# Patient Record
Sex: Male | Born: 1955 | Race: Black or African American | Hispanic: No | Marital: Single | Smoking: Former smoker
Health system: Southern US, Community
[De-identification: ages and names within clinical notes are randomized; demographics above are authoritative.]

---

## 2021-05-08 ENCOUNTER — Ambulatory Visit (HOSPITAL_COMMUNITY)
Admission: EM | Admit: 2021-05-08 | Discharge: 2021-05-08 | Disposition: A | Discharge status: Home / Self Care | Payer: Self-pay | Attending: Physician Assistant | Admitting: Physician Assistant

## 2021-05-08 ENCOUNTER — Encounter (HOSPITAL_COMMUNITY): Payer: Self-pay

## 2021-05-08 ENCOUNTER — Other Ambulatory Visit: Payer: Self-pay

## 2021-05-08 DIAGNOSIS — M545 Low back pain, unspecified: Secondary | ICD-10-CM

## 2021-05-08 MED ORDER — TIZANIDINE HCL 4 MG PO CAPS: 4.0000 mg | ORAL_CAPSULE | Freq: Two times a day (BID) | ORAL | 0 refills | Status: AC | PRN

## 2021-05-08 NOTE — ED Triage Notes (Signed)
Pt I with c/o lower back pain that has been going on for over 1 weeks  Pt has been taking tylenol and Bengay for relief  Pt states pain is worse when he moves

## 2021-05-08 NOTE — ED Provider Notes (Signed)
[facility]    CSN: [id number] Arrival date & time: 05/08/21  1038      History   Chief Complaint Chief Complaint  Patient presents with  . Back Pain    HPI Bobby Pope is a 65 y.o. male.   Patient presents today with a several month history of intermittent lower back pain that is worsened in the past several weeks.  He denies known injury but does work at [employer] and is tasked with lifting heavy things and believes this could be contributing to symptoms.  Pain is rated 6 on a 0-10 pain scale, localized to lower back without radiation, described as tightness/nagging, no aggravating leaving factors identified.  He has tried Tylenol, Bayer back and body, IcyHot with minimal improvement of symptoms.  He denies any previous spinal surgery or injury.  Denies history of malignancy.  Denies any bowel/bladder incontinence, lower extremity weakness, saddle anesthesia.  He has previously been prescribed muscle relaxers and found this to be very effective and is interested in starting this medication if appropriate today.     History reviewed. No pertinent past medical history.  There are no problems to display for this patient.   History reviewed. No pertinent surgical history.     Home Medications    Prior to Admission medications   Medication Sig Start Date End Date Taking? Authorizing Provider  tiZANidine (ZANAFLEX) 4 MG capsule Take 1 capsule (4 mg total) by mouth 2 (two) times daily as needed for muscle spasms. 05/08/21  Yes Keylen Uzelac, Derry Skill, PA-C    Family History History reviewed. No pertinent family history.  Social History Social History   Tobacco Use  . Smoking status: Current Some Day Smoker  . Smokeless tobacco: Never Used     Allergies   Patient has no known allergies.   Review of Systems Review of Systems  Constitutional: Negative for activity change, appetite change, fatigue and fever.  Respiratory: Negative for cough and shortness of  breath.   Cardiovascular: Negative for chest pain.  Gastrointestinal: Negative for abdominal pain, diarrhea, nausea and vomiting.  Musculoskeletal: Positive for back pain. Negative for arthralgias and myalgias.  Neurological: Negative for dizziness, weakness, light-headedness, numbness and headaches.     Physical Exam Triage Vital Signs ED Triage Vitals  Enc Vitals Group     BP 05/08/21 1107 134/77     Pulse Rate 05/08/21 1107 93     Resp 05/08/21 1107 17     Temp 05/08/21 1107 97.6 F (36.4 C)     Temp Source 05/08/21 1107 Oral     SpO2 05/08/21 1107 97 %     Weight --      Height --      Head Circumference --      Peak Flow --      Pain Score 05/08/21 1105 0     Pain Loc --      Pain Edu? --      Excl. in [place]? --    No data found.  Updated Vital Signs BP 134/77   Pulse 93   Temp 97.6 F (36.4 C) (Oral)   Resp 17   SpO2 97%   Visual Acuity Right Eye Distance:   Left Eye Distance:   Bilateral Distance:    Right Eye Near:   Left Eye Near:    Bilateral Near:     Physical Exam Vitals reviewed.  Constitutional:      General: He is awake.     Appearance: Normal  appearance. He is normal weight. He is not ill-appearing.     Comments: Very pleasant male appears stated age in no acute distress  HENT:     Head: Normocephalic and atraumatic.     Mouth/Throat:     Dentition: Abnormal dentition.     Pharynx: Uvula midline. No oropharyngeal exudate or posterior oropharyngeal erythema.  Cardiovascular:     Rate and Rhythm: Normal rate and regular rhythm.     Heart sounds: No murmur heard.   Pulmonary:     Effort: Pulmonary effort is normal.     Breath sounds: No stridor. Wheezing present. No rhonchi or rales.     Comments: Scattered wheezing throughout lung fields Abdominal:     General: Bowel sounds are normal.     Palpations: Abdomen is soft.     Tenderness: There is no abdominal tenderness.  Musculoskeletal:     Cervical back: No tenderness or bony  tenderness.     Thoracic back: No tenderness or bony tenderness.     Lumbar back: Tenderness present. No bony tenderness. Negative right straight leg raise test and negative left straight leg raise test.     Comments: Back: Decreased range of motion with rotation and forward flexion.  No pain on percussion of vertebrae.  Mild tenderness palpation of her bilateral lumbar paraspinal muscles.  Negative straight leg raise and Faber bilaterally.  Neurological:     Mental Status: He is alert.  Psychiatric:        Behavior: Behavior is cooperative.      UC Treatments / Results  Labs (all labs ordered are listed, but only abnormal results are displayed) Labs Reviewed - No data to display  EKG   Radiology No results found.  Procedures Procedures (including critical care time)  Medications Ordered in UC Medications - No data to display  Initial Impression / Assessment and Plan / UC Course  I have reviewed the triage vital signs and the nursing notes.  Pertinent labs & imaging results that were available during my care of the patient were reviewed by me and considered in my medical decision making (see chart for details).     No indication for x-ray given lack of traumatic injury and no bony tenderness on exam.  Patient was started on Zanaflex with instruction not to drive or drink alcohol with this medication as drowsiness is a common side effect.  Recommended he alternate Tylenol and ibuprofen for additional pain relief.  Encouraged him to use heat and stretch for symptom management.  Discussed that if symptoms persist will need to consider physical therapy referral and/or MRI which need to come from his PCP.  Discussed alarm symptoms that warrant emergent evaluation.  Strict return precautions given to which patient expressed understanding.  Final Clinical Impressions(s) / UC Diagnoses   Final diagnoses:  Acute bilateral low back pain without sciatica     Discharge Instructions      Take Zanaflex up to twice a day as needed for back pain.  This will make you sleepy so do not drive or drink alcohol with it.  Alternate Tylenol and ibuprofen over-the-counter.  You can use heat and stretch for symptom relief.  If anything worsens please return for reevaluation.    ED Prescriptions    Medication Sig Dispense Auth. Provider   tiZANidine (ZANAFLEX) 4 MG capsule Take 1 capsule (4 mg total) by mouth 2 (two) times daily as needed for muscle spasms. 14 capsule Marnee Sherrard, Derry Skill, PA-C  PDMP not reviewed this encounter.   Terrilee Croak, PA-C 05/08/21 1137

## 2021-05-08 NOTE — Discharge Instructions (Addendum)
Take Zanaflex up to twice a day as needed for back pain.  This will make you sleepy so do not drive or drink alcohol with it.  Alternate Tylenol and ibuprofen over-the-counter.  You can use heat and stretch for symptom relief.  If anything worsens please return for reevaluation.

## 2021-05-11 ENCOUNTER — Telehealth (HOSPITAL_COMMUNITY): Payer: Self-pay | Admitting: Emergency Medicine

## 2021-05-11 NOTE — Telephone Encounter (Signed)
Received message that patient had called wanting a more specific work note.    Called patient , verified identity with 2 identifiers.  Patient reports work place requested patient get a more specific note.  reveiwed instructions and work note.    Patient informed no reference to limited work duties.  Told patient that if he needed further follow up to contact pcp as ucc provider said.  Patient said he does not have a pcp.  Told patient if not getting better or worsening, she could return for reevaluation

## 2021-05-17 ENCOUNTER — Inpatient Hospital Stay (HOSPITAL_COMMUNITY)
Admission: EM | Admit: 2021-05-17 | Discharge: 2021-05-23 | DRG: 477 | Disposition: A | Discharge status: Home / Self Care | Payer: Medicaid Other | Attending: Internal Medicine | Admitting: Internal Medicine

## 2021-05-17 ENCOUNTER — Other Ambulatory Visit: Payer: Self-pay

## 2021-05-17 ENCOUNTER — Emergency Department (HOSPITAL_COMMUNITY): Payer: Medicaid Other

## 2021-05-17 DIAGNOSIS — Z6821 Body mass index (BMI) 21.0-21.9, adult: Secondary | ICD-10-CM

## 2021-05-17 DIAGNOSIS — R739 Hyperglycemia, unspecified: Secondary | ICD-10-CM | POA: Diagnosis present

## 2021-05-17 DIAGNOSIS — C761 Malignant neoplasm of thorax: Secondary | ICD-10-CM | POA: Diagnosis present

## 2021-05-17 DIAGNOSIS — R1909 Other intra-abdominal and pelvic swelling, mass and lump: Secondary | ICD-10-CM | POA: Diagnosis present

## 2021-05-17 DIAGNOSIS — R1900 Intra-abdominal and pelvic swelling, mass and lump, unspecified site: Secondary | ICD-10-CM | POA: Diagnosis present

## 2021-05-17 DIAGNOSIS — R591 Generalized enlarged lymph nodes: Secondary | ICD-10-CM

## 2021-05-17 DIAGNOSIS — C801 Malignant (primary) neoplasm, unspecified: Secondary | ICD-10-CM | POA: Diagnosis present

## 2021-05-17 DIAGNOSIS — C7951 Secondary malignant neoplasm of bone: Principal | ICD-10-CM | POA: Diagnosis present

## 2021-05-17 DIAGNOSIS — D630 Anemia in neoplastic disease: Secondary | ICD-10-CM | POA: Diagnosis present

## 2021-05-17 DIAGNOSIS — Z79899 Other long term (current) drug therapy: Secondary | ICD-10-CM

## 2021-05-17 DIAGNOSIS — N400 Enlarged prostate without lower urinary tract symptoms: Secondary | ICD-10-CM | POA: Diagnosis present

## 2021-05-17 DIAGNOSIS — E43 Unspecified severe protein-calorie malnutrition: Secondary | ICD-10-CM | POA: Insufficient documentation

## 2021-05-17 DIAGNOSIS — T1490XA Injury, unspecified, initial encounter: Secondary | ICD-10-CM

## 2021-05-17 DIAGNOSIS — Z20822 Contact with and (suspected) exposure to covid-19: Secondary | ICD-10-CM | POA: Diagnosis present

## 2021-05-17 DIAGNOSIS — J9601 Acute respiratory failure with hypoxia: Secondary | ICD-10-CM | POA: Diagnosis present

## 2021-05-17 DIAGNOSIS — R0602 Shortness of breath: Secondary | ICD-10-CM

## 2021-05-17 DIAGNOSIS — J90 Pleural effusion, not elsewhere classified: Secondary | ICD-10-CM | POA: Diagnosis present

## 2021-05-17 DIAGNOSIS — G8929 Other chronic pain: Secondary | ICD-10-CM | POA: Diagnosis present

## 2021-05-17 DIAGNOSIS — C3492 Malignant neoplasm of unspecified part of left bronchus or lung: Secondary | ICD-10-CM | POA: Diagnosis present

## 2021-05-17 DIAGNOSIS — F17210 Nicotine dependence, cigarettes, uncomplicated: Secondary | ICD-10-CM | POA: Diagnosis present

## 2021-05-17 LAB — CBC WITH DIFFERENTIAL/PLATELET
Abs Immature Granulocytes: 0.02 10*3/uL (ref 0.00–0.07)
Basophils Absolute: 0 10*3/uL (ref 0.0–0.1)
Basophils Relative: 0 %
Eosinophils Absolute: 0 10*3/uL (ref 0.0–0.5)
Eosinophils Relative: 0 %
HCT: 44.4 % (ref 39.0–52.0)
Hemoglobin: 14 g/dL (ref 13.0–17.0)
Immature Granulocytes: 0 %
Lymphocytes Relative: 7 %
Lymphs Abs: 0.7 10*3/uL (ref 0.7–4.0)
MCH: 29.1 pg (ref 26.0–34.0)
MCHC: 31.5 g/dL (ref 30.0–36.0)
MCV: 92.3 fL (ref 80.0–100.0)
Monocytes Absolute: 0.8 10*3/uL (ref 0.1–1.0)
Monocytes Relative: 9 %
Neutro Abs: 8.1 10*3/uL — ABNORMAL HIGH (ref 1.7–7.7)
Neutrophils Relative %: 84 %
Platelets: 366 10*3/uL (ref 150–400)
RBC: 4.81 MIL/uL (ref 4.22–5.81)
RDW: 14.8 % (ref 11.5–15.5)
WBC: 9.7 10*3/uL (ref 4.0–10.5)
nRBC: 0 % (ref 0.0–0.2)

## 2021-05-17 MED ORDER — FENTANYL CITRATE (PF) 100 MCG/2ML IJ SOLN
25.0000 ug | Freq: Once | INTRAMUSCULAR | Status: AC
  Administered 2021-05-18: 25 ug via INTRAVENOUS
  Filled 2021-05-17: qty 2

## 2021-05-17 NOTE — ED Notes (Signed)
Patient back from x-ray 

## 2021-05-17 NOTE — ED Notes (Signed)
Patient transported to X-ray 

## 2021-05-17 NOTE — ED Triage Notes (Addendum)
Pt came from home via POV. C/c: low back pain. Pt also reports falling while getting out of bath tub today. Pt reports falling on "back bone" on the side of the bath tub.  Pt reports being seen at [facility] last week for same complaint, was prescribed muscle relaxer without pain relief.

## 2021-05-18 ENCOUNTER — Encounter (HOSPITAL_COMMUNITY): Payer: Self-pay | Admitting: Emergency Medicine

## 2021-05-18 ENCOUNTER — Emergency Department (HOSPITAL_COMMUNITY): Payer: Medicaid Other

## 2021-05-18 ENCOUNTER — Other Ambulatory Visit: Payer: Self-pay

## 2021-05-18 ENCOUNTER — Inpatient Hospital Stay (HOSPITAL_COMMUNITY): Payer: Medicaid Other

## 2021-05-18 DIAGNOSIS — G8929 Other chronic pain: Secondary | ICD-10-CM | POA: Diagnosis present

## 2021-05-18 DIAGNOSIS — J9601 Acute respiratory failure with hypoxia: Secondary | ICD-10-CM | POA: Diagnosis present

## 2021-05-18 DIAGNOSIS — C3492 Malignant neoplasm of unspecified part of left bronchus or lung: Secondary | ICD-10-CM | POA: Diagnosis present

## 2021-05-18 DIAGNOSIS — Z20822 Contact with and (suspected) exposure to covid-19: Secondary | ICD-10-CM | POA: Diagnosis present

## 2021-05-18 DIAGNOSIS — R1900 Intra-abdominal and pelvic swelling, mass and lump, unspecified site: Secondary | ICD-10-CM

## 2021-05-18 DIAGNOSIS — D630 Anemia in neoplastic disease: Secondary | ICD-10-CM | POA: Diagnosis present

## 2021-05-18 DIAGNOSIS — Z79899 Other long term (current) drug therapy: Secondary | ICD-10-CM | POA: Diagnosis not present

## 2021-05-18 DIAGNOSIS — Z6821 Body mass index (BMI) 21.0-21.9, adult: Secondary | ICD-10-CM | POA: Diagnosis not present

## 2021-05-18 DIAGNOSIS — J90 Pleural effusion, not elsewhere classified: Secondary | ICD-10-CM | POA: Diagnosis present

## 2021-05-18 DIAGNOSIS — C801 Malignant (primary) neoplasm, unspecified: Secondary | ICD-10-CM

## 2021-05-18 DIAGNOSIS — E43 Unspecified severe protein-calorie malnutrition: Secondary | ICD-10-CM | POA: Diagnosis present

## 2021-05-18 DIAGNOSIS — R591 Generalized enlarged lymph nodes: Secondary | ICD-10-CM | POA: Insufficient documentation

## 2021-05-18 DIAGNOSIS — C761 Malignant neoplasm of thorax: Secondary | ICD-10-CM | POA: Diagnosis present

## 2021-05-18 DIAGNOSIS — N400 Enlarged prostate without lower urinary tract symptoms: Secondary | ICD-10-CM | POA: Diagnosis present

## 2021-05-18 DIAGNOSIS — R1909 Other intra-abdominal and pelvic swelling, mass and lump: Secondary | ICD-10-CM | POA: Diagnosis present

## 2021-05-18 DIAGNOSIS — F17210 Nicotine dependence, cigarettes, uncomplicated: Secondary | ICD-10-CM | POA: Diagnosis present

## 2021-05-18 DIAGNOSIS — R739 Hyperglycemia, unspecified: Secondary | ICD-10-CM | POA: Diagnosis present

## 2021-05-18 DIAGNOSIS — C7951 Secondary malignant neoplasm of bone: Secondary | ICD-10-CM | POA: Diagnosis present

## 2021-05-18 LAB — HIV ANTIBODY (ROUTINE TESTING W REFLEX): HIV Screen 4th Generation wRfx: NONREACTIVE

## 2021-05-18 LAB — RESP PANEL BY RT-PCR (FLU A&B, COVID) ARPGX2
Influenza A by PCR: NEGATIVE
Influenza B by PCR: NEGATIVE
SARS Coronavirus 2 by RT PCR: NEGATIVE

## 2021-05-18 LAB — COMPREHENSIVE METABOLIC PANEL
ALT: 12 U/L (ref 0–44)
ALT: 11 U/L (ref 0–44)
AST: 18 U/L (ref 15–41)
AST: 19 U/L (ref 15–41)
Albumin: 3.7 g/dL (ref 3.5–5.0)
Albumin: 3.4 g/dL — ABNORMAL LOW (ref 3.5–5.0)
Alkaline Phosphatase: 88 U/L (ref 38–126)
Alkaline Phosphatase: 81 U/L (ref 38–126)
Anion gap: 12 (ref 5–15)
Anion gap: 13 (ref 5–15)
BUN: 42 mg/dL — ABNORMAL HIGH (ref 8–23)
BUN: 33 mg/dL — ABNORMAL HIGH (ref 8–23)
CO2: 27 mmol/L (ref 22–32)
CO2: 28 mmol/L (ref 22–32)
Calcium: 14.3 mg/dL (ref 8.9–10.3)
Calcium: 13.3 mg/dL (ref 8.9–10.3)
Chloride: 98 mmol/L (ref 98–111)
Chloride: 97 mmol/L — ABNORMAL LOW (ref 98–111)
Creatinine, Ser: 0.92 mg/dL (ref 0.61–1.24)
Creatinine, Ser: 0.83 mg/dL (ref 0.61–1.24)
GFR, Estimated: >60 mL/min (ref >60)
GFR, Estimated: >60 mL/min (ref >60)
Glucose, Bld: 114 mg/dL — ABNORMAL HIGH (ref 70–99)
Glucose, Bld: 109 mg/dL — ABNORMAL HIGH (ref 70–99)
Potassium: 4.2 mmol/L (ref 3.5–5.1)
Potassium: 4.6 mmol/L (ref 3.5–5.1)
Sodium: 137 mmol/L (ref 135–145)
Sodium: 138 mmol/L (ref 135–145)
Total Bilirubin: 0.8 mg/dL (ref 0.3–1.2)
Total Bilirubin: 0.9 mg/dL (ref 0.3–1.2)
Total Protein: 7.3 g/dL (ref 6.5–8.1)
Total Protein: 7.1 g/dL (ref 6.5–8.1)

## 2021-05-18 LAB — MAGNESIUM
Magnesium: 1.5 mg/dL — ABNORMAL LOW (ref 1.7–2.4)
Magnesium: 1.5 mg/dL — ABNORMAL LOW (ref 1.7–2.4)

## 2021-05-18 LAB — CBC WITH DIFFERENTIAL/PLATELET
Abs Immature Granulocytes: 0.03 10*3/uL (ref 0.00–0.07)
Basophils Absolute: 0 10*3/uL (ref 0.0–0.1)
Basophils Relative: 0 %
Eosinophils Absolute: 0 10*3/uL (ref 0.0–0.5)
Eosinophils Relative: 0 %
HCT: 42.5 % (ref 39.0–52.0)
Hemoglobin: 13.7 g/dL (ref 13.0–17.0)
Immature Granulocytes: 0 %
Lymphocytes Relative: 7 %
Lymphs Abs: 0.6 10*3/uL — ABNORMAL LOW (ref 0.7–4.0)
MCH: 29.5 pg (ref 26.0–34.0)
MCHC: 32.2 g/dL (ref 30.0–36.0)
MCV: 91.6 fL (ref 80.0–100.0)
Monocytes Absolute: 0.8 10*3/uL (ref 0.1–1.0)
Monocytes Relative: 9 %
Neutro Abs: 7.8 10*3/uL — ABNORMAL HIGH (ref 1.7–7.7)
Neutrophils Relative %: 84 %
Platelets: 335 10*3/uL (ref 150–400)
RBC: 4.64 MIL/uL (ref 4.22–5.81)
RDW: 15.1 % (ref 11.5–15.5)
WBC: 9.3 10*3/uL (ref 4.0–10.5)
nRBC: 0 % (ref 0.0–0.2)

## 2021-05-18 LAB — TROPONIN I (HIGH SENSITIVITY)
Troponin I (High Sensitivity): 3 ng/L (ref <18)
Troponin I (High Sensitivity): 4 ng/L (ref <18)

## 2021-05-18 LAB — PHOSPHORUS: Phosphorus: 3.1 mg/dL (ref 2.5–4.6)

## 2021-05-18 MED ORDER — MAGNESIUM SULFATE 4 GM/100ML IV SOLN
4.0000 g | Freq: Once | INTRAVENOUS | Status: AC
  Administered 2021-05-18: 4 g via INTRAVENOUS
  Filled 2021-05-18: qty 100

## 2021-05-18 MED ORDER — HYDROMORPHONE HCL 1 MG/ML IJ SOLN
0.5000 mg | INTRAMUSCULAR | Status: DC | PRN
  Administered 2021-05-19: 1 mg via INTRAVENOUS
  Filled 2021-05-18: qty 1

## 2021-05-18 MED ORDER — ONDANSETRON HCL 4 MG/2ML IJ SOLN: 4.0000 mg | Freq: Four times a day (QID) | INTRAMUSCULAR | Status: DC | PRN

## 2021-05-18 MED ORDER — ONDANSETRON HCL 4 MG PO TABS: 4.0000 mg | ORAL_TABLET | Freq: Four times a day (QID) | ORAL | Status: DC | PRN

## 2021-05-18 MED ORDER — SODIUM CHLORIDE 0.9 % IV SOLN: INTRAVENOUS | Status: AC

## 2021-05-18 MED ORDER — HYDROCODONE-ACETAMINOPHEN 5-325 MG PO TABS
1.0000 | ORAL_TABLET | ORAL | Status: DC | PRN
  Administered 2021-05-20 – 2021-05-21 (×4): 2 via ORAL
  Filled 2021-05-18 (×5): qty 2

## 2021-05-18 MED ORDER — SENNOSIDES-DOCUSATE SODIUM 8.6-50 MG PO TABS: 1.0000 | ORAL_TABLET | Freq: Every evening | ORAL | Status: DC | PRN

## 2021-05-18 MED ORDER — ENSURE ENLIVE PO LIQD
237.0000 mL | Freq: Two times a day (BID) | ORAL | Status: DC
  Administered 2021-05-19: 237 mL via ORAL

## 2021-05-18 MED ORDER — BISACODYL 5 MG PO TBEC: 5.0000 mg | DELAYED_RELEASE_TABLET | Freq: Every day | ORAL | Status: DC | PRN

## 2021-05-18 MED ORDER — ZOLEDRONIC ACID 4 MG/5ML IV CONC
4.0000 mg | Freq: Once | INTRAVENOUS | Status: AC
  Administered 2021-05-18: 4 mg via INTRAVENOUS
  Filled 2021-05-18: qty 5

## 2021-05-18 MED ORDER — IOHEXOL 300 MG/ML  SOLN
100.0000 mL | Freq: Once | INTRAMUSCULAR | Status: AC | PRN
  Administered 2021-05-18: 100 mL via INTRAVENOUS

## 2021-05-18 MED ORDER — SODIUM CHLORIDE (PF) 0.9 % IJ SOLN
INTRAMUSCULAR | Status: AC
  Filled 2021-05-18: qty 50

## 2021-05-18 MED ORDER — GADOBUTROL 1 MMOL/ML IV SOLN
6.0000 mL | Freq: Once | INTRAVENOUS | Status: AC | PRN
  Administered 2021-05-18: 6 mL via INTRAVENOUS

## 2021-05-18 MED ORDER — ACETAMINOPHEN 650 MG RE SUPP: 650.0000 mg | Freq: Four times a day (QID) | RECTAL | Status: DC | PRN

## 2021-05-18 MED ORDER — DEXAMETHASONE SODIUM PHOSPHATE 4 MG/ML IJ SOLN
4.0000 mg | Freq: Once | INTRAMUSCULAR | Status: AC
  Administered 2021-05-18: 4 mg via INTRAVENOUS
  Filled 2021-05-18: qty 1

## 2021-05-18 MED ORDER — MAGNESIUM SULFATE 2 GM/50ML IV SOLN
2.0000 g | Freq: Once | INTRAVENOUS | Status: AC
  Administered 2021-05-18: 2 g via INTRAVENOUS
  Filled 2021-05-18: qty 50

## 2021-05-18 MED ORDER — ACETAMINOPHEN 325 MG PO TABS
650.0000 mg | ORAL_TABLET | Freq: Four times a day (QID) | ORAL | Status: DC | PRN
  Administered 2021-05-20 – 2021-05-23 (×6): 650 mg via ORAL
  Filled 2021-05-18 (×6): qty 2

## 2021-05-18 MED ORDER — METHOCARBAMOL 1000 MG/10ML IJ SOLN
500.0000 mg | Freq: Four times a day (QID) | INTRAMUSCULAR | Status: DC | PRN
  Filled 2021-05-18: qty 5

## 2021-05-18 MED ORDER — SODIUM CHLORIDE 0.9 % IV BOLUS
1000.0000 mL | Freq: Once | INTRAVENOUS | Status: AC
  Administered 2021-05-18: 1000 mL via INTRAVENOUS

## 2021-05-18 MED ORDER — FUROSEMIDE 10 MG/ML IJ SOLN
40.0000 mg | Freq: Once | INTRAMUSCULAR | Status: AC
  Administered 2021-05-18: 40 mg via INTRAVENOUS
  Filled 2021-05-18: qty 4

## 2021-05-18 MED ORDER — LORAZEPAM 2 MG/ML IJ SOLN: 1.0000 mg | Freq: Once | INTRAMUSCULAR | Status: DC | PRN

## 2021-05-18 MED ORDER — CALCITONIN (SALMON) 200 UNIT/ACT NA SOLN
1.0000 | Freq: Once | NASAL | Status: AC
  Administered 2021-05-18: 1 via NASAL
  Filled 2021-05-18: qty 3.7

## 2021-05-18 NOTE — ED Notes (Signed)
Calcium 13.3 MD aware

## 2021-05-18 NOTE — Progress Notes (Signed)
     [address]       [place] [address]             [phone]       Images reviewed. Extensive tumor infiltration of the left lung, pleural space and mediastinum Concern for metastatic disease to the retroperitoneum  Unfortunately, patient is not a surgical candidate.   Would recommend Pulm vs IR consult for image guided biopsy Will need PET CT, and MRI brain, but this can be done as outpatient  Daneille Desilva Bary Leriche

## 2021-05-18 NOTE — Consult Note (Signed)
NAME:  Bobby Pope, MRN:  [id number], DOB:  [birth year], LOS: 0 ADMISSION DATE:  05/17/2021, CONSULTATION DATE:  05/18/21 REFERRING MD:  [facility], CHIEF COMPLAINT:  Back pain/ lung mass  History of Present Illness:  65 year old thin chronically ill-appearing AAM who is a current smoker and has really seen physician and denies any other medical history who presented to the emergency room for evaluation of worsening back pain.  ? Wt loss over 30 pounds. .  He has an occasional nonproductive cough but did not feel shortness of breath.  Upon ED evaluation he was found to be afebrile and slightly tachypneic and EKG did show some ST elevations inferiorly but his high-sensitivity troponins were normal.  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  He was given IV fluids, Lasix, calcitonin and Decadron as well as Zometa in the ED.  CT surgery evaluated his scans and felt that he is not a surgical candidate and recommended pulmonary versus interventional radiology evaluation for image guided biopsy.  Recommendation from Dr. Kipp Brood was to obtain a PET CT scan and MRI of the brain.    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 Underlying pulmonary mass difficult to measure/visualize. Associated loculated left pleural effusion. Invasion into the mediastinum with associated narrowing of the left main pulmonary artery as well as left mainstem bronchus. 2. Mediastinal  and left hilar lymphadenopathy. 3. Several right lower lobe pulmonary nodules measuring up to 0.9 x 0.5 cm.   4. Axial and appendicular skeleton lytic lesions with cortical destruction consistent with metastases. 5. Question pathologic nondisplaced fracture of the right posterior tenth rib. 6. A left retroperitoneal 3.3 x 2.1 cm lobulated lesion may be arising from the left adrenal gland versus represent a lymph node. Finding likely metastasis. .     Scheduled Meds: Continuous Infusions: . sodium chloride 150 mL/hr at 05/18/21 0839  . magnesium sulfate bolus IVPB 2 g (05/18/21 1152)  . methocarbamol (ROBAXIN) IV     PRN Meds:.acetaminophen **OR** acetaminophen, bisacodyl, HYDROcodone-acetaminophen, HYDROmorphone (DILAUDID) injection, LORazepam, methocarbamol (ROBAXIN) IV, ondansetron **OR** ondansetron (ZOFRAN) IV, senna-docusate    Interim History / Subjective:  Denies pain or sob at present/ sisters at bedside   Objective   Blood pressure 111/76, pulse 83, temperature 98.7 F (37.1 C), resp. rate (!) 23, height _0  (1.753 m), weight 64.9 kg, SpO2 96 %.        Intake/Output Summary (Last 24 hours) at 05/18/2021 1241 Last data filed at 05/18/2021 0646 Gross per 24 hour  Intake 1221.82 ml  Output 1275 ml  Net -53.18 ml   Filed Weights   05/17/21 2058  Weight: 64.9 kg    Examination: Tmax 98.7  General appearance:    Chronically ill thin bm > stated age  Very hoarse  At Rest 02 sats  94% on RA  No jvd/ no nodes palp Oropharynx clear,  mucosa nl Neck supple Lungs decreased bs on L c dullness/ no wheeze   RRR no s3 or or sign murmur Abd obese  with nl  excursion  Extr warm with no edema or def clubbing noted Neuro  Sensorium intact ,  no apparent motor deficits        Assessment & Plan:  1) 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   2) Trachea appears shifted to L by effects of mass and loculated effusion - of the two,  the effusion would be the easiest to access for cytology with plenty of cells if needed for molecular studies.   >>> rec dx thoracentesis under u/s asap    We can put on the list for FOB 05/20/21 in afternoon if above can't be accomplished quicker by IR   3) Hypercalcemia approp rx by [facility]/agree with plan.    Labs   CBC: Recent Labs  Lab 05/17/21 2327 05/18/21 0850  WBC 9.7 9.3  NEUTROABS 8.1* 7.8*  HGB 14.0 13.7  HCT 44.4 42.5  MCV 92.3 91.6  PLT 366 503    Basic Metabolic Panel: Recent Labs  Lab 05/17/21 2327 05/18/21 0850  NA 137 138  K 4.2 4.6  CL 98 97*  CO2 27 28  GLUCOSE 114* 109*  BUN 42* 33*  CREATININE 0.92 0.83  CALCIUM 14.3* 13.3*  MG 1.5* 1.5*  PHOS  --  3.1   GFR: Estimated Creatinine Clearance: 82.5 mL/min (by C-G formula based on SCr of 0.83 mg/dL). Recent Labs  Lab 05/17/21 2327 05/18/21 0850  WBC 9.7 9.3    Liver Function Tests: Recent Labs  Lab 05/17/21 2327 05/18/21 0850  AST 18 19  ALT 12 11  ALKPHOS 88 81  BILITOT 0.8 0.9  PROT 7.3 7.1  ALBUMIN 3.7 3.4*   No results for input(s): LIPASE, AMYLASE in the last 168 hours. No results for input(s): AMMONIA in the last 168 hours.  ABG No results found for: PHART, PCO2ART, PO2ART, HCO3, TCO2, ACIDBASEDEF, O2SAT   Coagulation Profile: No results for input(s): INR, PROTIME in the last 168 hours.  Cardiac Enzymes: No results for input(s): CKTOTAL, CKMB, CKMBINDEX, TROPONINI in the last 168 hours.  HbA1C: No results found for: HGBA1C  CBG: No results for input(s): GLUCAP in the last 168 hours.     Past Medical History:  He,  has no past medical history on file.   Surgical History:  History reviewed. No pertinent surgical history.   Social History:   reports that he has been smoking cigarettes. He has been smoking about 0.25 packs per day. He has never used smokeless tobacco. He reports current alcohol use.   Family History:  His Family history is unknown by patient.    Allergies No Known Allergies   Home Medications  Prior to Admission medications   Medication Sig Start Date End Date Taking? Authorizing Provider  tiZANidine (ZANAFLEX) 4 MG capsule Take 1 capsule (4 mg total) by mouth 2 (two) times daily as needed for muscle spasms. 05/08/21  Yes Raspet, Derry Skill, PA-C      Christinia Gully, MD [facility] [place] [phone]   After 7:00 pm call Elink  [phone]

## 2021-05-18 NOTE — ED Provider Notes (Signed)
[facility] DEPT Provider Note   CSN: [id number] Arrival date & time: 05/17/21  2049     History Chief Complaint  Patient presents with  . Back Pain  . Fall    Bobby Pope is a 65 y.o. male without known past medical history, presenting to the emergency department for evaluation of chronic back pain.  He states he has been having multiple months of low back pain that comes and goes.  He thinks initially was from heavy lifting though does not recall a particular injury.  Reports back pain is bilateral low back and radiates around his hips.  Sometimes it improves, sometimes it is worse.  It is feeling okay over the last 3 days though he woke up today with pain once more.  He was seen at urgent care for this chronic back pain on 05/08/2021, was discharged with symptomatic management.  He states he fell while in the tub today.  He fell directly down onto his bottom.  Does not think he had any trauma to his chest or ribs.  He is not having any pain to his ribs.  Did not hit his head. Patient denies numbness or weakness in his extremities, bowel or bladder incontinence, saddle paresthesias.  Denies fevers, history of cancer, history of IV drug use.  He has family at bedside.  She reports concern for weight loss over the last multiple months.  Family reports he used to weigh about 200 pounds.  This is unintentional weight loss.  He reports about 45 to 50 pounds.  He states he has poor appetite.  He has not been vomiting or having diarrhea.  He does smoke cigarettes daily.  Has not seen a medical doctor in many years.  Has never had a colonoscopy.  The history is provided by the patient and a relative.       History reviewed. No pertinent past medical history.  There are no problems to display for this patient.   History reviewed. No pertinent surgical history.     History reviewed. No pertinent family history.  Social History   Tobacco Use  . Smoking  status: Current Some Day Smoker  . Smokeless tobacco: Never Used    Home Medications Prior to Admission medications   Medication Sig Start Date End Date Taking? Authorizing Provider  tiZANidine (ZANAFLEX) 4 MG capsule Take 1 capsule (4 mg total) by mouth 2 (two) times daily as needed for muscle spasms. 05/08/21   Raspet, Derry Skill, PA-C    Allergies    Patient has no known allergies.  Review of Systems   Review of Systems  Constitutional: Positive for appetite change and unexpected weight change.  Musculoskeletal: Positive for back pain.  All other systems reviewed and are negative.   Physical Exam Updated Vital Signs BP 117/79 (BP Location: Right Arm)   Pulse 87   Temp 98.1 F (36.7 C) (Oral)   Resp (!) 24   Ht 5\' 9"  (1.753 m)   Wt 64.9 kg   SpO2 92%   BMI 21.12 kg/m   Physical Exam Vitals and nursing note reviewed.  Constitutional:      Appearance: He is well-developed.     Comments: Thin male  HENT:     Head: Normocephalic and atraumatic.  Eyes:     Conjunctiva/sclera: Conjunctivae normal.  Cardiovascular:     Rate and Rhythm: Normal rate and regular rhythm.  Pulmonary:     Comments: Rhonchorous and diminished. No bruising or deformity to  the chest wall.  No tenderness.   Abdominal:     General: Bowel sounds are normal.     Palpations: Abdomen is soft.     Tenderness: There is no abdominal tenderness.  Musculoskeletal:     Comments: No midline T or L-spine tenderness, no bony step-offs or gross deformities.  No producible pain along the low back.  Skin:    General: Skin is warm.  Neurological:     Mental Status: He is alert.     Comments: Normal tone.  5/5 strength in BLE including bilateral hip flexion, as well as strong and equal dorsiflexion/plantar flexion Sensory: light touch normal in BLE extremities.   Gait: deferred due to aoub CV: distal pulses palpable throughout    Psychiatric:        Behavior: Behavior normal.     ED Results / Procedures  / Treatments   Labs (all labs ordered are listed, but only abnormal results are displayed) Labs Reviewed  COMPREHENSIVE METABOLIC PANEL - Abnormal; Notable for the following components:      Result Value   Glucose, Bld 114 (*)    BUN 42 (*)    Calcium 14.3 (*)    All other components within normal limits  CBC WITH DIFFERENTIAL/PLATELET - Abnormal; Notable for the following components:   Neutro Abs 8.1 (*)    All other components within normal limits  RESP PANEL BY RT-PCR (FLU A&B, COVID) ARPGX2  MAGNESIUM  TROPONIN I (HIGH SENSITIVITY)    EKG None  Radiology DG Chest 2 View  Result Date: 05/17/2021 CLINICAL DATA:  Lower back pain, status post fall. EXAM: CHEST - 2 VIEW COMPARISON:  None. FINDINGS: There is complete opacification of the left hemithorax with a very small amount of aerated lung noted within the left lung base. The right lung is mildly hyperinflated and otherwise clear. Right to left shift of the cardiac silhouette is noted which is subsequently limited in evaluation. There is marked severity calcification of the thoracic aorta. Ill-defined deformities of the third, sixth and seventh left ribs are seen. IMPRESSION: Findings likely consistent with partial collapse of the left lung with associated atelectasis, infiltrate and pleural effusion. Correlation with chest CT is recommended, as an underlying mass cannot be excluded. Electronically Signed   By: Virgina Norfolk M.D.   On: 05/17/2021 23:28   DG Lumbar Spine Complete  Result Date: 05/17/2021 CLINICAL DATA:  Status post fall with lower back pain. EXAM: LUMBAR SPINE - COMPLETE 4+ VIEW COMPARISON:  None. FINDINGS: There is no evidence of acute lumbar spine fracture. Alignment is normal. Mild multilevel endplate sclerosis is seen. Intervertebral disc spaces are maintained. Moderate severity calcification of the abdominal aorta is noted. IMPRESSION: Multilevel degenerative changes without an acute lumbar spine fracture or  subluxation. Electronically Signed   By: Virgina Norfolk M.D.   On: 05/17/2021 23:33    Procedures .Critical Care Performed by: Genevieve Ritzel, [place] N, PA-C Authorized by: Torryn Hudspeth, [place] N, PA-C   Critical care provider statement:    Critical care time (minutes):  60   Critical care time was exclusive of:  Separately billable procedures and treating other patients and teaching time   Critical care was necessary to treat or prevent imminent or life-threatening deterioration of the following conditions: severe hypercalcemia.   Critical care was time spent personally by me on the following activities:  Discussions with consultants, evaluation of patient's response to treatment, examination of patient, ordering and performing treatments and interventions, ordering and review of laboratory  studies, ordering and review of radiographic studies, pulse oximetry, re-evaluation of patient's condition, obtaining history from patient or surrogate and review of old charts   I assumed direction of critical care for this patient from another provider in my specialty: no       Medications Ordered in ED Medications  sodium chloride 0.9 % bolus 1,000 mL (has no administration in time range)  furosemide (LASIX) injection 40 mg (has no administration in time range)  zolendronic acid (ZOMETA) 4 mg in sodium chloride 0.9 % 100 mL IVPB (has no administration in time range)  dexamethasone (DECADRON) injection 4 mg (has no administration in time range)  calcitonin (salmon) (MIACALCIN/FORTICAL) nasal spray 1 spray (has no administration in time range)  iohexol (OMNIPAQUE) 300 MG/ML solution 100 mL (has no administration in time range)  fentaNYL (SUBLIMAZE) injection 25 mcg (25 mcg Intravenous Given 05/18/21 0011)    ED Course  I have reviewed the triage vital signs and the nursing notes.  Pertinent labs & imaging results that were available during my care of the patient were reviewed by me and considered in my  medical decision making (see chart for details).    MDM Rules/Calculators/A&P                          Patient is a 65 year old male that is not have outpatient medical care, presenting for chronic intermittent worsening low back pain over multiple months as well as poor p.o. intake with unexpected weight loss of at least 45 pounds over the last multiple months.  Intermittently feels short of breath.  On evaluation lung sounds are diminished, he is rhonchorous, he has a wet sounding cough.  He is thin.  He has no neurodeficits or symptoms concerning for cauda equina.  He is able to reproduce his back pain with movement to though he has no tenderness on exam.  He fell onto his bottom today in the bathtub, does not believe he had a rib injury though he is on sure.  He did not hit his head.  He has no chest wall tenderness, bruising or deformity.    Patient has concerning story for malignancy.  Blood work is ordered, as well as chest x-ray and plain films of the L-spine.  Plain films reveal chronic changes about the L-spine.  Chest x-ray with left lung collapse, mass versus effusion versus pneumothorax with hemothorax.  He is not in respiratory distress, he is not tachycardic.  He does have some minimal shortness of breath.  Followed with CT imaging ordered.   Patient made aware of abnormal CXR and need for admission. He in agreement with care plan at this time.   Blood work reveals critically high calcium level 14.3. Attending physician Dr. Randal Buba made aware. Medications initiated for severe hypercalcemia per attending physician recommendation. EKG is abnormal, pt not having chest pain though will obtain troponin. Mag level also ordered.  Care handed off at shift change to [place], PA-C. Plan to follow CT imaging and admit.     Final Clinical Impression(s) / ED Diagnoses Final diagnoses:  Trauma  Trauma    Rx / DC Orders ED Discharge Orders    None       Lister Brizzi, [place] N,  PA-C 05/18/21 0033    Palumbo, April, MD 05/18/21 1610

## 2021-05-18 NOTE — ED Notes (Signed)
Patient transported to CT 

## 2021-05-18 NOTE — ED Notes (Signed)
Lab to add on troponin  

## 2021-05-18 NOTE — ED Provider Notes (Signed)
Here for lower back pain after falling while in the shower Back pain for several months, thought was heavy lifting but continues.  No reproducible, worse with twisting Unintentional weight loss, 50 pounds 2-3 months Abnormal CXR - he reports "sometimes" SOB, cough  Concern for cancer  Ca 14.3 - getting fluids, 4 mg decadron, 40 lasix, myocalcin nasal, zometa iv  CT chest/abd/pel, L-spine  Admit when resulted  CT results: IMPRESSION: 1. 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 Underlying pulmonary mass difficult to measure/visualize. Associated loculated left pleural effusion. Invasion into the mediastinum with associated narrowing of the left main pulmonary artery as well as left mainstem bronchus. 2. Mediastinal and left hilar lymphadenopathy. 3. Several right lower lobe pulmonary nodules measuring up to 0.9 x 0.5 cm. Findings could represent metastases versus infection/inflammation given debris within the right mainstem bronchus and trachea as well as within the right lower lobe bronchioles with associated right lower lobe mucous plugging. 4. Axial and appendicular skeleton lytic lesions with cortical destruction consistent with metastases. 5. Question pathologic nondisplaced fracture of the right posterior tenth rib. 6. A left retroperitoneal 3.3 x 2.1 cm lobulated lesion may be arising from the left adrenal gland versus represent a lymph node. Finding likely metastasis. 7. Prostatomegaly.  Discussed admission with Dr. Myna Hidalgo, [facility], who will see the patient for admission.      Charlann Lange, PA-C 05/18/21 0303    Palumbo, April, MD 05/18/21 3149

## 2021-05-18 NOTE — Consult Note (Signed)
Chief Complaint: Patient was seen in consultation today for lymphadenopathy/biopsy.  Referring Physician(s): Kerney Elbe [facility])  Supervising Physician: Aletta Edouard  Patient Status: [facility] - ED  History of Present Illness: Bobby Pope is a 65 y.o. male with a past medical history of tobacco use (rarely sees physician and denies other PMH). He presented to [facility] ED 05/17/2021 secondary to back pain and weight loss. Imaging concerning for metastatic disease. He was admitted for further evaluation. Oncology was consulted who requests tissue biopsy for diagnosis.  CT chest/abdomen/pelvis today: 1. 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 Underlying pulmonary mass difficult to measure/visualize. Associated loculated left pleural effusion. Invasion into the mediastinum with associated narrowing of the left main pulmonary artery as well as left mainstem bronchus. 2. Mediastinal and left hilar lymphadenopathy. 3. Several right lower lobe pulmonary nodules measuring up to 0.9 x 0.5 cm. Findings could represent metastases versus infection/inflammation given debris within the right mainstem bronchus and trachea as well as within the right lower lobe bronchioles with associated right lower lobe mucous plugging. 4. Axial and appendicular skeleton lytic lesions with cortical destruction consistent with metastases. 5. Question pathologic nondisplaced fracture of the right posterior tenth rib. 6. A left retroperitoneal 3.3 x 2.1 cm lobulated lesion may be arising from the left adrenal gland versus represent a lymph node. Finding likely metastasis. 7. Prostatomegaly.  IR consulted by Dr. Alfredia Ferguson for possible image-guided biopsy for tissue diagnosis. Patient awake and alert sitting in bed with no complaints at this time. Sister at bedside. Denies fever, chills, chest pain, dyspnea, abdominal pain, or headache.   History reviewed.  No pertinent past medical history.  History reviewed. No pertinent surgical history.  Allergies: Patient has no known allergies.  Medications: Prior to Admission medications   Medication Sig Start Date End Date Taking? Authorizing Provider  tiZANidine (ZANAFLEX) 4 MG capsule Take 1 capsule (4 mg total) by mouth 2 (two) times daily as needed for muscle spasms. 05/08/21  Yes Raspet, Derry Skill, PA-C     Family History  Family history unknown: Yes    Social History   Socioeconomic History  . Marital status: Single    Spouse name: Not on file  . Number of children: Not on file  . Years of education: Not on file  . Highest education level: Not on file  Occupational History  . Not on file  Tobacco Use  . Smoking status: Current Every Day Smoker    Packs/day: 0.25    Types: Cigarettes  . Smokeless tobacco: Never Used  Substance and Sexual Activity  . Alcohol use: Yes    Comment: occasional, denies hx of withdrawal   . Drug use: Not on file  . Sexual activity: Not on file  Other Topics Concern  . Not on file  Social History Narrative  . Not on file   Social Determinants of Health   Financial Resource Strain: Not on file  Food Insecurity: Not on file  Transportation Needs: Not on file  Physical Activity: Not on file  Stress: Not on file  Social Connections: Not on file     Review of Systems: A 12 point ROS discussed and pertinent positives are indicated in the HPI above.  All other systems are negative.  Review of Systems  Constitutional: Negative for chills and fever.  Respiratory: Negative for shortness of breath and wheezing.   Cardiovascular: Negative for chest pain and palpitations.  Gastrointestinal: Negative for abdominal pain.  Neurological: Negative  for headaches.  Psychiatric/Behavioral: Negative for confusion.    Vital Signs: BP 111/76   Pulse 83   Temp 98.7 F (37.1 C)   Resp (!) 23   Ht 5\' 9"  (1.753 m)   Wt 143 lb (64.9 kg)   SpO2 96%   BMI  21.12 kg/m   Physical Exam Vitals and nursing note reviewed.  Constitutional:      General: He is not in acute distress. Cardiovascular:     Rate and Rhythm: Normal rate and regular rhythm.     Heart sounds: Normal heart sounds. No murmur heard.   Pulmonary:     Effort: Pulmonary effort is normal. No respiratory distress.     Breath sounds: Wheezing present.     Comments: Decreased breath sounds on left. Skin:    General: Skin is warm and dry.  Neurological:     Mental Status: He is alert and oriented to person, place, and time.      MD Evaluation Airway: WNL Heart: WNL Abdomen: WNL Chest/ Lungs: WNL ASA  Classification: 3 Mallampati/Airway Score: Two   Imaging: DG Chest 2 View  Result Date: 05/17/2021 CLINICAL DATA:  Lower back pain, status post fall. EXAM: CHEST - 2 VIEW COMPARISON:  None. FINDINGS: There is complete opacification of the left hemithorax with a very small amount of aerated lung noted within the left lung base. The right lung is mildly hyperinflated and otherwise clear. Right to left shift of the cardiac silhouette is noted which is subsequently limited in evaluation. There is marked severity calcification of the thoracic aorta. Ill-defined deformities of the third, sixth and seventh left ribs are seen. IMPRESSION: Findings likely consistent with partial collapse of the left lung with associated atelectasis, infiltrate and pleural effusion. Correlation with chest CT is recommended, as an underlying mass cannot be excluded. Electronically Signed   By: Virgina Norfolk M.D.   On: 05/17/2021 23:28   DG Lumbar Spine Complete  Result Date: 05/17/2021 CLINICAL DATA:  Status post fall with lower back pain. EXAM: LUMBAR SPINE - COMPLETE 4+ VIEW COMPARISON:  None. FINDINGS: There is no evidence of acute lumbar spine fracture. Alignment is normal. Mild multilevel endplate sclerosis is seen. Intervertebral disc spaces are maintained. Moderate severity calcification of the  abdominal aorta is noted. IMPRESSION: Multilevel degenerative changes without an acute lumbar spine fracture or subluxation. Electronically Signed   By: Virgina Norfolk M.D.   On: 05/17/2021 23:33   CT CHEST ABDOMEN PELVIS W CONTRAST  Result Date: 05/18/2021 CLINICAL DATA:  Lower back pain status post fall. Partial collapse of left lung with associated infiltrate and pleural effusion. Question underlying mass on chest x-ray. EXAM: CT CHEST, ABDOMEN, AND PELVIS WITH CONTRAST CT LUMBAR SPINE WITHOUT CONTRAST TECHNIQUE: Multidetector CT imaging of the chest, abdomen and pelvis was performed following the standard protocol during bolus administration of intravenous contrast. Multidetector CT imaging of the lumbar spine was performed without intravenous contrast administration. Multiplanar CT image reconstructions were also generated. CONTRAST:  163mL OMNIPAQUE IOHEXOL 300 MG/ML  SOLN COMPARISON:  Chest x-ray 05/17/2021 FINDINGS: CT CHEST FINDINGS Cardiovascular: Normal heart size. No significant pericardial effusion. The thoracic aorta is normal in caliber. Mild-to-moderate atherosclerotic plaque of the thoracic aorta. At least 3 vessel coronary artery calcifications. The main pulmonary artery is enlarged in caliber measuring up to 3.7 cm. No central or proximal segmental pulmonary embolus. Mediastinum/Nodes: Mediastinal lymphadenopathy with as an example a 1.5 cm precarinal lymph node (2:27). Also noted subcarinal lymph node measuring 1.6 cm (  2:33). No definite right hilar lymphadenopathy. Left hilar lymphadenopathy unable to measure. Lungs/Pleura: Almost complete heterogeneous opacification of the left hemithorax with associated pleural thickening and nodularity. Underlying pulmonary mass is difficult to measure. Invasion into the mediastinum is noted. Associated narrowing of the left main pulmonary artery as well as left mainstem bronchus. Associated loculated left pleural effusion. Several right lower lobe  pulmonary nodule measuring up to 0.9 x 0.5 cm (4:121). Calcified pulmonary nodule within the right upper lobe. Pulmonary micronodule within the right upper lobe (4:35). Debris within the right mainstem bronchus and trachea. As well as within the right lower lobe bronchials. Musculoskeletal: Scattered axial and appendicular skeleton lytic lesions. Question pathologic nondisplaced fracture of the right posterior tenth rib (2:49). CT ABDOMEN PELVIS FINDINGS Hepatobiliary: No focal liver abnormality. No gallstones, gallbladder wall thickening, or pericholecystic fluid. No biliary dilatation. Pancreas: No focal lesion. Normal pancreatic contour. No surrounding inflammatory changes. No main pancreatic ductal dilatation. Spleen: Normal in size without focal abnormality. Adrenals/Urinary Tract: No right adrenal nodule. There is a retroperitoneal left upper 3.3 x 2.1 cm lobulated lesion that may be arising from the left adrenal gland. Bilateral kidneys enhance symmetrically. No hydronephrosis. No hydroureter. The urinary bladder is unremarkable. Stomach/Bowel: Stomach is within normal limits. No evidence of bowel wall thickening or dilatation. Appendix appears normal. Vascular/Lymphatic: No abdominal aorta or iliac aneurysm. Moderate atherosclerotic plaque of the aorta and its branches. No pelvic or inguinal lymphadenopathy. Reproductive: The prostate is enlarged measuring up to 5.1 cm. Other: No intraperitoneal free fluid. No intraperitoneal free gas. No organized fluid collection. Musculoskeletal: Scattered axial and appendicular skeleton lytic lesions. CT LUMBAR SPINE: Segmentation: 5 non-rib-bearing lumbar vertebral bodies. Alignment: Normal. Vertebra: Several lytic lesions with cortical destruction, as an example: Posterior wall and inferior endplate of the L4 level (504:22), right iliac bone (3:97), S1 level (504:22, 3:101). Soft tissues: Question associated soft tissue densities along the lytic lesions with as an  example along the right iliac bone (2:91). IMPRESSION: 1. 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 Underlying pulmonary mass difficult to measure/visualize. Associated loculated left pleural effusion. Invasion into the mediastinum with associated narrowing of the left main pulmonary artery as well as left mainstem bronchus. 2. Mediastinal and left hilar lymphadenopathy. 3. Several right lower lobe pulmonary nodules measuring up to 0.9 x 0.5 cm. Findings could represent metastases versus infection/inflammation given debris within the right mainstem bronchus and trachea as well as within the right lower lobe bronchioles with associated right lower lobe mucous plugging. 4. Axial and appendicular skeleton lytic lesions with cortical destruction consistent with metastases. 5. Question pathologic nondisplaced fracture of the right posterior tenth rib. 6. A left retroperitoneal 3.3 x 2.1 cm lobulated lesion may be arising from the left adrenal gland versus represent a lymph node. Finding likely metastasis. 7. Prostatomegaly. Electronically Signed   By: Iven Finn M.D.   On: 05/18/2021 02:05   CT L-SPINE NO CHARGE  Result Date: 05/18/2021 CLINICAL DATA:  Lower back pain status post fall. Partial collapse of left lung with associated infiltrate and pleural effusion. Question underlying mass on chest x-ray. EXAM: CT CHEST, ABDOMEN, AND PELVIS WITH CONTRAST CT LUMBAR SPINE WITHOUT CONTRAST TECHNIQUE: Multidetector CT imaging of the chest, abdomen and pelvis was performed following the standard protocol during bolus administration of intravenous contrast. Multidetector CT imaging of the lumbar spine was performed without intravenous contrast administration. Multiplanar CT image reconstructions were also generated. CONTRAST:  153mL OMNIPAQUE IOHEXOL 300 MG/ML  SOLN COMPARISON:  Chest x-ray 05/17/2021 FINDINGS: CT CHEST FINDINGS Cardiovascular: Normal  heart size. No significant pericardial effusion. The thoracic aorta is normal in caliber. Mild-to-moderate atherosclerotic plaque of the thoracic aorta. At least 3 vessel coronary artery calcifications. The main pulmonary artery is enlarged in caliber measuring up to 3.7 cm. No central or proximal segmental pulmonary embolus. Mediastinum/Nodes: Mediastinal lymphadenopathy with as an example a 1.5 cm precarinal lymph node (2:27). Also noted subcarinal lymph node measuring 1.6 cm (2:33). No definite right hilar lymphadenopathy. Left hilar lymphadenopathy unable to measure. Lungs/Pleura: Almost complete heterogeneous opacification of the left hemithorax with associated pleural thickening and nodularity. Underlying pulmonary mass is difficult to measure. Invasion into the mediastinum is noted. Associated narrowing of the left main pulmonary artery as well as left mainstem bronchus. Associated loculated left pleural effusion. Several right lower lobe pulmonary nodule measuring up to 0.9 x 0.5 cm (4:121). Calcified pulmonary nodule within the right upper lobe. Pulmonary micronodule within the right upper lobe (4:35). Debris within the right mainstem bronchus and trachea. As well as within the right lower lobe bronchials. Musculoskeletal: Scattered axial and appendicular skeleton lytic lesions. Question pathologic nondisplaced fracture of the right posterior tenth rib (2:49). CT ABDOMEN PELVIS FINDINGS Hepatobiliary: No focal liver abnormality. No gallstones, gallbladder wall thickening, or pericholecystic fluid. No biliary dilatation. Pancreas: No focal lesion. Normal pancreatic contour. No surrounding inflammatory changes. No main pancreatic ductal dilatation. Spleen: Normal in size without focal abnormality. Adrenals/Urinary Tract: No right adrenal nodule. There is a retroperitoneal left upper 3.3 x 2.1 cm lobulated lesion that may be arising from the left adrenal gland. Bilateral kidneys enhance symmetrically. No  hydronephrosis. No hydroureter. The urinary bladder is unremarkable. Stomach/Bowel: Stomach is within normal limits. No evidence of bowel wall thickening or dilatation. Appendix appears normal. Vascular/Lymphatic: No abdominal aorta or iliac aneurysm. Moderate atherosclerotic plaque of the aorta and its branches. No pelvic or inguinal lymphadenopathy. Reproductive: The prostate is enlarged measuring up to 5.1 cm. Other: No intraperitoneal free fluid. No intraperitoneal free gas. No organized fluid collection. Musculoskeletal: Scattered axial and appendicular skeleton lytic lesions. CT LUMBAR SPINE: Segmentation: 5 non-rib-bearing lumbar vertebral bodies. Alignment: Normal. Vertebra: Several lytic lesions with cortical destruction, as an example: Posterior wall and inferior endplate of the L4 level (504:22), right iliac bone (3:97), S1 level (504:22, 3:101). Soft tissues: Question associated soft tissue densities along the lytic lesions with as an example along the right iliac bone (2:91). IMPRESSION: 1. 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 Underlying pulmonary mass difficult to measure/visualize. Associated loculated left pleural effusion. Invasion into the mediastinum with associated narrowing of the left main pulmonary artery as well as left mainstem bronchus. 2. Mediastinal and left hilar lymphadenopathy. 3. Several right lower lobe pulmonary nodules measuring up to 0.9 x 0.5 cm. Findings could represent metastases versus infection/inflammation given debris within the right mainstem bronchus and trachea as well as within the right lower lobe bronchioles with associated right lower lobe mucous plugging. 4. Axial and appendicular skeleton lytic lesions with cortical destruction consistent with metastases. 5. Question pathologic nondisplaced fracture of the right posterior tenth rib. 6. A left retroperitoneal 3.3 x 2.1 cm lobulated lesion may be  arising from the left adrenal gland versus represent a lymph node. Finding likely metastasis. 7. Prostatomegaly. Electronically Signed   By: Iven Finn M.D.   On: 05/18/2021 02:05    Labs:  CBC: Recent Labs    05/17/21 2327 05/18/21 0850  WBC 9.7 9.3  HGB 14.0  13.7  HCT 44.4 42.5  PLT 366 335    COAGS: No results for input(s): INR, APTT in the last 8760 hours.  BMP: Recent Labs    05/17/21 2327 05/18/21 0850  NA 137 138  K 4.2 4.6  CL 98 97*  CO2 27 28  GLUCOSE 114* 109*  BUN 42* 33*  CALCIUM 14.3* 13.3*  CREATININE 0.92 0.83  GFRNONAA >60 >60    LIVER FUNCTION TESTS: Recent Labs    05/17/21 2327 05/18/21 0850  BILITOT 0.8 0.9  AST 18 19  ALT 12 11  ALKPHOS 88 81  PROT 7.3 7.1  ALBUMIN 3.7 3.4*     Assessment and Plan:  Imaging/clinical findings concerning for metastatic disease (left hemithorax malignancy, underlying pulmonary mass, pulmonary nodules, lymphadenopathy, lytic lesions, tobacco use, unintentional weight loss). Plan for image-guided left supraclavicular lymph node biopsy in IR tentatively for Monday 05/20/2021 pending IR schedule. Per Dr. Kathlene Cote, if unable to visualize LN with [place] will recommend bronch for possible biopsy. Patient will be NPO at midnight. Afebrile.  Risks and benefits discussed with the patient including, but not limited to bleeding, infection, damage to adjacent structures or low yield requiring additional tests. All of the patient's questions were answered, patient is agreeable to proceed. Consent signed and in IR control room.   Thank you for this interesting consult.  I greatly enjoyed meeting Riel Petrow and look forward to participating in their care.  A copy of this report was sent to the requesting provider on this date.  Electronically Signed: Earley Abide, PA-C 05/18/2021, 2:59 PM   I spent a total of 20 Minutes in face to face in clinical consultation, greater than 50% of which was  counseling/coordinating care for lymphadenopathy/biopsy.

## 2021-05-18 NOTE — Progress Notes (Signed)
Care started prior to midnight in the emergency room and patient was admitted early this morning after midnight by Dr. Christia Reading Opyd and I am in current agreement with this assessment and plan.  Additional changes of the plan of care been made accordingly.  The patient is a 65 year old thin chronically ill-appearing AAM who is a current smoker and has really seen physician and denies any other medical history who presented to the emergency room for evaluation of worsening back pain.  His sister also raise concern for unintentional weight loss for the last few months and patient does not know how much she lost with thinks is over 30 pounds.  He reported waxing and waning back pain without injury or inciting event involving bilateral lower back with no extremity numbness or weakness.  He denies any incontinence or saddle anesthesia but did report a loss of appetite and weight loss over several months.  He has an occasional nonproductive cough but did not feel shortness of breath.  Upon ED evaluation he was found to be afebrile and slightly tachypneic and EKG did show some ST elevations inferiorly but his high-sensitivity troponins were normal.  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  He was given IV fluids, Lasix, calcitonin and Decadron as well as Zometa in the ED.  CT surgery evaluated his scans and felt that he is not a surgical candidate and recommended pulmonary versus interventional radiology evaluation for image guided biopsy.  Recommendation from Dr. Kipp Brood was to obtain a PET CT scan and MRI of the brain.  We will obtain an MRI of the brain here and we have consulted pulmonary and interventional  radiology for further evaluation and assistance.  Currently he is being evaluated and Treated for the following but not limited to:  Metastatic disease with suspecting lung cancer primary -Smoker who denies PMHx but has not seen a physician in many years presents with worsening back pain and is found to have left lung mass with loculated effusion, invasion into mediastinum, lytic bone lesions, RLL nodules, and retroperitoneal lesion  -Discussed with CT surgery who will review scans  and he is currently not a surgical candidate -Discussed with oncology, will need biopsy, MRI brain if small cell lung cancer   -Consulted pulmonary and interventional radiology for biopsy  Hypercalcemia  -Serum calcium 14.3 on admission  and improved to 13.3 -Appears to be asymptomatic, likely related to his malignancy  -Treated with Zometa, calcitonin, and IVF in ED  -Continue volume expansion -Continue monitor and trend and repeat CMP in a.m.  Hypomagnesemia -Patient's Mag Level was 1.5 -Replete with IV Mag Sulfate 2 grams -Continue to Monitor and Trend -Repeat Mag Level in the AM  Hyperglycemia -Patient's blood sugar ranging from 109-114 -Likely reactive and also in the setting of steroid demargination -Check hemoglobin A1c next-continue to monitor blood sugars per protocol and if necessary will add a sensitive NovoLog sliding scale insulin AC   We will continue to monitor the patient's clinical response to intervention and repeat blood work and follow-up on MRI imaging in the a.m.

## 2021-05-18 NOTE — H&P (Signed)
History and Physical    Dabid Godown NLZ:[id number] DOB: January 27, 1956 DOA: 05/17/2021  PCP: Pcp, No   Patient coming from: Home   Chief Complaint: Back pain, weight loss   HPI: Bobby Pope is a 65 y.o. male with medical history significant for current smoker, has rarely seen a physician and denies any other known medical history, now presenting to the emergency department for evaluation of worsening back pain.  His sister also raises concern for unintentional weight loss over the past few months.  Patient reports waxing and waning back pain without any injury or inciting event, involving the bilateral lower back with no lower extremity numbness or weakness.  He denies incontinence or saddle paresthesias.  He denies any fevers or chills.  He reports a loss of appetite and 40 to 50 pound weight loss over several months.  He denies chest pain.  He has occasional nonproductive cough but does not feel particularly short of breath.  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  ED Course: Upon arrival to the ED, patient is found to be afebrile, saturating low 90s on room air, slightly tachypneic, and with stable blood pressure.  EKG features sinus rhythm with ST elevations inferiorly.  High-sensitivity troponin was normal x2.  Chemistry panel with elevated BUN to creatinine ratio, magnesium 1.5, and calcium 14.3.  CBC unremarkable.  CT of the chest, abdomen, and pelvis is concerning for left hemithorax malignancy with associated pleural thickening and nodularity, loculated effusion, invasion into the mediastinum with narrowing of the left main pulmonary artery and left mainstem bronchus, skeletal lytic lesions, and retroperitoneal lesion.  Patient was given IV fluids, Lasix, calcitonin, Decadron, and Zometa in the ED.  Review of Systems:  All other systems reviewed and apart from HPI, are negative.  History reviewed. No pertinent past medical history.  History reviewed. No pertinent surgical  history.  Social History:   reports that he has been smoking cigarettes. He has been smoking about 0.25 packs per day. He has never used smokeless tobacco. He reports current alcohol use. No history on file for drug use.  No Known Allergies  Family History  Family history unknown: Yes     Prior to Admission medications   Medication Sig Start Date End Date Taking? Authorizing Provider  tiZANidine (ZANAFLEX) 4 MG capsule Take 1 capsule (4 mg total) by mouth 2 (two) times daily as needed for muscle spasms. 05/08/21  Yes Raspet, Derry Skill, PA-C    Physical Exam: Vitals:   05/18/21 0400 05/18/21 0430 05/18/21 0502 05/18/21 0512  BP: 111/73 96/61 98/74    Pulse: 88 90 90   Resp: (!) 24 (!) 28 (!) 24   Temp:    98.3 F (36.8 C)  TempSrc:    Oral  SpO2: 96% 96% 99%   Weight:      Height:        Constitutional: NAD, calm, cachectic   Eyes: PERTLA, lids and conjunctivae normal ENMT: Mucous membranes are moist. Posterior pharynx clear of any exudate or lesions.   Neck: supple, no masses  Respiratory: Diminished on left, no wheezing, no crackles. No accessory muscle use.  Cardiovascular: S1 & S2 heard, regular rate and rhythm. No extremity edema. Abdomen: No distension, no tenderness, soft. Bowel sounds active.  Musculoskeletal: no clubbing / cyanosis. No joint deformity upper and lower extremities.   Skin: no significant rashes, lesions, ulcers. Warm, dry, well-perfused. Neurologic: CN 2-12 grossly intact. Sensation intact. Moving all extremities.  Psychiatric: Alert and oriented  to person, place, and situation. Pleasant and cooperative.    Labs and Imaging on Admission: I have personally reviewed following labs and imaging studies  CBC: Recent Labs  Lab 05/17/21 2327  WBC 9.7  NEUTROABS 8.1*  HGB 14.0  HCT 44.4  MCV 92.3  PLT 737   Basic Metabolic Panel: Recent Labs  Lab 05/17/21 2327  NA 137  K 4.2  CL 98  CO2 27  GLUCOSE 114*  BUN 42*  CREATININE 0.92  CALCIUM  14.3*  MG 1.5*   GFR: Estimated Creatinine Clearance: 74.5 mL/min (by C-G formula based on SCr of 0.92 mg/dL). Liver Function Tests: Recent Labs  Lab 05/17/21 2327  AST 18  ALT 12  ALKPHOS 88  BILITOT 0.8  PROT 7.3  ALBUMIN 3.7   No results for input(s): LIPASE, AMYLASE in the last 168 hours. No results for input(s): AMMONIA in the last 168 hours. Coagulation Profile: No results for input(s): INR, PROTIME in the last 168 hours. Cardiac Enzymes: No results for input(s): CKTOTAL, CKMB, CKMBINDEX, TROPONINI in the last 168 hours. BNP (last 3 results) No results for input(s): PROBNP in the last 8760 hours. HbA1C: No results for input(s): HGBA1C in the last 72 hours. CBG: No results for input(s): GLUCAP in the last 168 hours. Lipid Profile: No results for input(s): CHOL, HDL, LDLCALC, TRIG, CHOLHDL, LDLDIRECT in the last 72 hours. Thyroid Function Tests: No results for input(s): TSH, T4TOTAL, FREET4, T3FREE, THYROIDAB in the last 72 hours. Anemia Panel: No results for input(s): VITAMINB12, FOLATE, FERRITIN, TIBC, IRON, RETICCTPCT in the last 72 hours. Urine analysis: No results found for: COLORURINE, APPEARANCEUR, LABSPEC, PHURINE, GLUCOSEU, HGBUR, BILIRUBINUR, KETONESUR, PROTEINUR, UROBILINOGEN, NITRITE, LEUKOCYTESUR Sepsis Labs: @LABRCNTIP (procalcitonin:4,lacticidven:4) ) Recent Results (from the past 240 hour(s))  Resp Panel by RT-PCR (Flu A&B, Covid) Nasopharyngeal Swab     Status: None   Collection Time: 05/18/21 12:18 AM   Specimen: Nasopharyngeal Swab; Nasopharyngeal(NP) swabs in vial transport medium  Result Value Ref Range Status   SARS Coronavirus 2 by RT PCR NEGATIVE NEGATIVE Final    Comment: (NOTE) SARS-CoV-2 target nucleic acids are NOT DETECTED.  The SARS-CoV-2 RNA is generally detectable in upper respiratory specimens during the acute phase of infection. The lowest concentration of SARS-CoV-2 viral copies this assay can detect is 138 copies/mL. A negative  result does not preclude SARS-Cov-2 infection and should not be used as the sole basis for treatment or other patient management decisions. A negative result may occur with  improper specimen collection/handling, submission of specimen other than nasopharyngeal swab, presence of viral mutation(s) within the areas targeted by this assay, and inadequate number of viral copies(<138 copies/mL). A negative result must be combined with clinical observations, patient history, and epidemiological information. The expected result is Negative.  Fact Sheet for Patients:  EntrepreneurPulse.com.au  Fact Sheet for Healthcare Providers:  IncredibleEmployment.be  This test is no t yet approved or cleared by the [place] FDA and  has been authorized for detection and/or diagnosis of SARS-CoV-2 by FDA under an Emergency Use Authorization (EUA). This EUA will remain  in effect (meaning this test can be used) for the duration of the COVID-19 declaration under Section 564(b)(1) of the Act, 21 U.S.C.section 360bbb-3(b)(1), unless the authorization is terminated  or revoked sooner.       Influenza A by PCR NEGATIVE NEGATIVE Final   Influenza B by PCR NEGATIVE NEGATIVE Final    Comment: (NOTE) The Xpert Xpress SARS-CoV-2/FLU/RSV plus assay is intended as an aid in  the diagnosis of influenza from Nasopharyngeal swab specimens and should not be used as a sole basis for treatment. Nasal washings and aspirates are unacceptable for Xpert Xpress SARS-CoV-2/FLU/RSV testing.  Fact Sheet for Patients: EntrepreneurPulse.com.au  Fact Sheet for Healthcare Providers: IncredibleEmployment.be  This test is not yet approved or cleared by the [place] FDA and has been authorized for detection and/or diagnosis of SARS-CoV-2 by FDA under an Emergency Use Authorization (EUA). This EUA will remain in effect (meaning this test can be used)  for the duration of the COVID-19 declaration under Section 564(b)(1) of the Act, 21 U.S.C. section 360bbb-3(b)(1), unless the authorization is terminated or revoked.  Performed at [facility], [place] [address], [place], [place] [address]      Radiological Exams on Admission: DG Chest 2 View  Result Date: 05/17/2021 CLINICAL DATA:  Lower back pain, status post fall. EXAM: CHEST - 2 VIEW COMPARISON:  None. FINDINGS: There is complete opacification of the left hemithorax with a very small amount of aerated lung noted within the left lung base. The right lung is mildly hyperinflated and otherwise clear. Right to left shift of the cardiac silhouette is noted which is subsequently limited in evaluation. There is marked severity calcification of the thoracic aorta. Ill-defined deformities of the third, sixth and seventh left ribs are seen. IMPRESSION: Findings likely consistent with partial collapse of the left lung with associated atelectasis, infiltrate and pleural effusion. Correlation with chest CT is recommended, as an underlying mass cannot be excluded. Electronically Signed   By: Virgina Norfolk M.D.   On: 05/17/2021 23:28   DG Lumbar Spine Complete  Result Date: 05/17/2021 CLINICAL DATA:  Status post fall with lower back pain. EXAM: LUMBAR SPINE - COMPLETE 4+ VIEW COMPARISON:  None. FINDINGS: There is no evidence of acute lumbar spine fracture. Alignment is normal. Mild multilevel endplate sclerosis is seen. Intervertebral disc spaces are maintained. Moderate severity calcification of the abdominal aorta is noted. IMPRESSION: Multilevel degenerative changes without an acute lumbar spine fracture or subluxation. Electronically Signed   By: Virgina Norfolk M.D.   On: 05/17/2021 23:33   CT CHEST ABDOMEN PELVIS W CONTRAST  Result Date: 05/18/2021 CLINICAL DATA:  Lower back pain status post fall. Partial collapse of left lung with associated infiltrate and pleural effusion.  Question underlying mass on chest x-ray. EXAM: CT CHEST, ABDOMEN, AND PELVIS WITH CONTRAST CT LUMBAR SPINE WITHOUT CONTRAST TECHNIQUE: Multidetector CT imaging of the chest, abdomen and pelvis was performed following the standard protocol during bolus administration of intravenous contrast. Multidetector CT imaging of the lumbar spine was performed without intravenous contrast administration. Multiplanar CT image reconstructions were also generated. CONTRAST:  172mL OMNIPAQUE IOHEXOL 300 MG/ML  SOLN COMPARISON:  Chest x-ray 05/17/2021 FINDINGS: CT CHEST FINDINGS Cardiovascular: Normal heart size. No significant pericardial effusion. The thoracic aorta is normal in caliber. Mild-to-moderate atherosclerotic plaque of the thoracic aorta. At least 3 vessel coronary artery calcifications. The main pulmonary artery is enlarged in caliber measuring up to 3.7 cm. No central or proximal segmental pulmonary embolus. Mediastinum/Nodes: Mediastinal lymphadenopathy with as an example a 1.5 cm precarinal lymph node (2:27). Also noted subcarinal lymph node measuring 1.6 cm (2:33). No definite right hilar lymphadenopathy. Left hilar lymphadenopathy unable to measure. Lungs/Pleura: Almost complete heterogeneous opacification of the left hemithorax with associated pleural thickening and nodularity. Underlying pulmonary mass is difficult to measure. Invasion into the mediastinum is noted. Associated narrowing of the left main pulmonary artery as well as left mainstem bronchus.  Associated loculated left pleural effusion. Several right lower lobe pulmonary nodule measuring up to 0.9 x 0.5 cm (4:121). Calcified pulmonary nodule within the right upper lobe. Pulmonary micronodule within the right upper lobe (4:35). Debris within the right mainstem bronchus and trachea. As well as within the right lower lobe bronchials. Musculoskeletal: Scattered axial and appendicular skeleton lytic lesions. Question pathologic nondisplaced fracture of the  right posterior tenth rib (2:49). CT ABDOMEN PELVIS FINDINGS Hepatobiliary: No focal liver abnormality. No gallstones, gallbladder wall thickening, or pericholecystic fluid. No biliary dilatation. Pancreas: No focal lesion. Normal pancreatic contour. No surrounding inflammatory changes. No main pancreatic ductal dilatation. Spleen: Normal in size without focal abnormality. Adrenals/Urinary Tract: No right adrenal nodule. There is a retroperitoneal left upper 3.3 x 2.1 cm lobulated lesion that may be arising from the left adrenal gland. Bilateral kidneys enhance symmetrically. No hydronephrosis. No hydroureter. The urinary bladder is unremarkable. Stomach/Bowel: Stomach is within normal limits. No evidence of bowel wall thickening or dilatation. Appendix appears normal. Vascular/Lymphatic: No abdominal aorta or iliac aneurysm. Moderate atherosclerotic plaque of the aorta and its branches. No pelvic or inguinal lymphadenopathy. Reproductive: The prostate is enlarged measuring up to 5.1 cm. Other: No intraperitoneal free fluid. No intraperitoneal free gas. No organized fluid collection. Musculoskeletal: Scattered axial and appendicular skeleton lytic lesions. CT LUMBAR SPINE: Segmentation: 5 non-rib-bearing lumbar vertebral bodies. Alignment: Normal. Vertebra: Several lytic lesions with cortical destruction, as an example: Posterior wall and inferior endplate of the L4 level (504:22), right iliac bone (3:97), S1 level (504:22, 3:101). Soft tissues: Question associated soft tissue densities along the lytic lesions with as an example along the right iliac bone (2:91). IMPRESSION: 1. 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 Underlying pulmonary mass difficult to measure/visualize. Associated loculated left pleural effusion. Invasion into the mediastinum with associated narrowing of the left main pulmonary artery as well as left mainstem bronchus. 2.  Mediastinal and left hilar lymphadenopathy. 3. Several right lower lobe pulmonary nodules measuring up to 0.9 x 0.5 cm. Findings could represent metastases versus infection/inflammation given debris within the right mainstem bronchus and trachea as well as within the right lower lobe bronchioles with associated right lower lobe mucous plugging. 4. Axial and appendicular skeleton lytic lesions with cortical destruction consistent with metastases. 5. Question pathologic nondisplaced fracture of the right posterior tenth rib. 6. A left retroperitoneal 3.3 x 2.1 cm lobulated lesion may be arising from the left adrenal gland versus represent a lymph node. Finding likely metastasis. 7. Prostatomegaly. Electronically Signed   By: Iven Finn M.D.   On: 05/18/2021 02:05   CT L-SPINE NO CHARGE  Result Date: 05/18/2021 CLINICAL DATA:  Lower back pain status post fall. Partial collapse of left lung with associated infiltrate and pleural effusion. Question underlying mass on chest x-ray. EXAM: CT CHEST, ABDOMEN, AND PELVIS WITH CONTRAST CT LUMBAR SPINE WITHOUT CONTRAST TECHNIQUE: Multidetector CT imaging of the chest, abdomen and pelvis was performed following the standard protocol during bolus administration of intravenous contrast. Multidetector CT imaging of the lumbar spine was performed without intravenous contrast administration. Multiplanar CT image reconstructions were also generated. CONTRAST:  145mL OMNIPAQUE IOHEXOL 300 MG/ML  SOLN COMPARISON:  Chest x-ray 05/17/2021 FINDINGS: CT CHEST FINDINGS Cardiovascular: Normal heart size. No significant pericardial effusion. The thoracic aorta is normal in caliber. Mild-to-moderate atherosclerotic plaque of the thoracic aorta. At least 3 vessel coronary artery calcifications. The main pulmonary artery is enlarged in caliber measuring up to 3.7 cm. No central or proximal  segmental pulmonary embolus. Mediastinum/Nodes: Mediastinal lymphadenopathy with as an example a 1.5  cm precarinal lymph node (2:27). Also noted subcarinal lymph node measuring 1.6 cm (2:33). No definite right hilar lymphadenopathy. Left hilar lymphadenopathy unable to measure. Lungs/Pleura: Almost complete heterogeneous opacification of the left hemithorax with associated pleural thickening and nodularity. Underlying pulmonary mass is difficult to measure. Invasion into the mediastinum is noted. Associated narrowing of the left main pulmonary artery as well as left mainstem bronchus. Associated loculated left pleural effusion. Several right lower lobe pulmonary nodule measuring up to 0.9 x 0.5 cm (4:121). Calcified pulmonary nodule within the right upper lobe. Pulmonary micronodule within the right upper lobe (4:35). Debris within the right mainstem bronchus and trachea. As well as within the right lower lobe bronchials. Musculoskeletal: Scattered axial and appendicular skeleton lytic lesions. Question pathologic nondisplaced fracture of the right posterior tenth rib (2:49). CT ABDOMEN PELVIS FINDINGS Hepatobiliary: No focal liver abnormality. No gallstones, gallbladder wall thickening, or pericholecystic fluid. No biliary dilatation. Pancreas: No focal lesion. Normal pancreatic contour. No surrounding inflammatory changes. No main pancreatic ductal dilatation. Spleen: Normal in size without focal abnormality. Adrenals/Urinary Tract: No right adrenal nodule. There is a retroperitoneal left upper 3.3 x 2.1 cm lobulated lesion that may be arising from the left adrenal gland. Bilateral kidneys enhance symmetrically. No hydronephrosis. No hydroureter. The urinary bladder is unremarkable. Stomach/Bowel: Stomach is within normal limits. No evidence of bowel wall thickening or dilatation. Appendix appears normal. Vascular/Lymphatic: No abdominal aorta or iliac aneurysm. Moderate atherosclerotic plaque of the aorta and its branches. No pelvic or inguinal lymphadenopathy. Reproductive: The prostate is enlarged measuring up  to 5.1 cm. Other: No intraperitoneal free fluid. No intraperitoneal free gas. No organized fluid collection. Musculoskeletal: Scattered axial and appendicular skeleton lytic lesions. CT LUMBAR SPINE: Segmentation: 5 non-rib-bearing lumbar vertebral bodies. Alignment: Normal. Vertebra: Several lytic lesions with cortical destruction, as an example: Posterior wall and inferior endplate of the L4 level (504:22), right iliac bone (3:97), S1 level (504:22, 3:101). Soft tissues: Question associated soft tissue densities along the lytic lesions with as an example along the right iliac bone (2:91). IMPRESSION: 1. 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 Underlying pulmonary mass difficult to measure/visualize. Associated loculated left pleural effusion. Invasion into the mediastinum with associated narrowing of the left main pulmonary artery as well as left mainstem bronchus. 2. Mediastinal and left hilar lymphadenopathy. 3. Several right lower lobe pulmonary nodules measuring up to 0.9 x 0.5 cm. Findings could represent metastases versus infection/inflammation given debris within the right mainstem bronchus and trachea as well as within the right lower lobe bronchioles with associated right lower lobe mucous plugging. 4. Axial and appendicular skeleton lytic lesions with cortical destruction consistent with metastases. 5. Question pathologic nondisplaced fracture of the right posterior tenth rib. 6. A left retroperitoneal 3.3 x 2.1 cm lobulated lesion may be arising from the left adrenal gland versus represent a lymph node. Finding likely metastasis. 7. Prostatomegaly. Electronically Signed   By: Iven Finn M.D.   On: 05/18/2021 02:05    EKG: Independently reviewed. Sinus rhythm, inferior ST elevations.   Assessment/Plan   1. Metastatic disease  - Smoker who denies PMHx but has not seen a physician in many years presents with worsening back  pain and is found to have left lung mass with loculated effusion, invasion into mediastinum, lytic bone lesions, RLL nodules, and retroperitoneal lesion  - Discussed with CT surgery who will review scans  - Discussed with  oncology, will need biopsy, MRI brain if small cell lung cancer    2. Hypercalcemia  - Serum calcium 14.3 on admission  - Appears to be asymptomatic, likely related to his malignancy  - Treated with Zometa, calcitonin, and IVF in ED  - Continue volume expansion, repeat chem panel     DVT prophylaxis: SCDs  Code Status: Full  Level of Care: Level of care: Telemetry Family Communication: None present, patient states that he discussed the diagnosis with his sister already  Disposition Plan:  Patient is from: home  Anticipated d/c is to: TBD Anticipated d/c date is: 05/21/21 Patient currently: Pending further workup   Consults called: Discussed with oncology, CT surgery  Admission status: Inpatient     Vianne Bulls, MD [facility]  05/18/2021, 7:54 AM

## 2021-05-19 ENCOUNTER — Inpatient Hospital Stay (HOSPITAL_COMMUNITY): Payer: Medicaid Other

## 2021-05-19 LAB — CBC WITH DIFFERENTIAL/PLATELET
Abs Immature Granulocytes: 0.02 10*3/uL (ref 0.00–0.07)
Basophils Absolute: 0 10*3/uL (ref 0.0–0.1)
Basophils Relative: 0 %
Eosinophils Absolute: 0 10*3/uL (ref 0.0–0.5)
Eosinophils Relative: 0 %
HCT: 42 % (ref 39.0–52.0)
Hemoglobin: 13.2 g/dL (ref 13.0–17.0)
Immature Granulocytes: 0 %
Lymphocytes Relative: 7 %
Lymphs Abs: 0.5 10*3/uL — ABNORMAL LOW (ref 0.7–4.0)
MCH: 29.3 pg (ref 26.0–34.0)
MCHC: 31.4 g/dL (ref 30.0–36.0)
MCV: 93.3 fL (ref 80.0–100.0)
Monocytes Absolute: 0.8 10*3/uL (ref 0.1–1.0)
Monocytes Relative: 12 %
Neutro Abs: 5.7 10*3/uL (ref 1.7–7.7)
Neutrophils Relative %: 81 %
Platelets: 332 10*3/uL (ref 150–400)
RBC: 4.5 MIL/uL (ref 4.22–5.81)
RDW: 15.2 % (ref 11.5–15.5)
WBC: 7.1 10*3/uL (ref 4.0–10.5)
nRBC: 0 % (ref 0.0–0.2)

## 2021-05-19 LAB — COMPREHENSIVE METABOLIC PANEL
ALT: 10 U/L (ref 0–44)
AST: 16 U/L (ref 15–41)
Albumin: 3.1 g/dL — ABNORMAL LOW (ref 3.5–5.0)
Alkaline Phosphatase: 77 U/L (ref 38–126)
Anion gap: 10 (ref 5–15)
BUN: 30 mg/dL — ABNORMAL HIGH (ref 8–23)
CO2: 28 mmol/L (ref 22–32)
Calcium: 11.5 mg/dL — ABNORMAL HIGH (ref 8.9–10.3)
Chloride: 100 mmol/L (ref 98–111)
Creatinine, Ser: 0.82 mg/dL (ref 0.61–1.24)
GFR, Estimated: >60 mL/min (ref >60)
Glucose, Bld: 90 mg/dL (ref 70–99)
Potassium: 4.2 mmol/L (ref 3.5–5.1)
Sodium: 138 mmol/L (ref 135–145)
Total Bilirubin: 0.6 mg/dL (ref 0.3–1.2)
Total Protein: 6.8 g/dL (ref 6.5–8.1)

## 2021-05-19 LAB — PHOSPHORUS: Phosphorus: 1.5 mg/dL — ABNORMAL LOW (ref 2.5–4.6)

## 2021-05-19 LAB — MAGNESIUM: Magnesium: 1.8 mg/dL (ref 1.7–2.4)

## 2021-05-19 MED ORDER — SODIUM PHOSPHATES 45 MMOLE/15ML IV SOLN
20.0000 mmol | Freq: Once | INTRAVENOUS | Status: AC
  Administered 2021-05-19: 20 mmol via INTRAVENOUS
  Filled 2021-05-19: qty 6.67

## 2021-05-19 MED ORDER — SODIUM CHLORIDE 0.9 % IV SOLN: INTRAVENOUS | Status: DC

## 2021-05-19 MED ORDER — MAGNESIUM SULFATE 2 GM/50ML IV SOLN
2.0000 g | Freq: Once | INTRAVENOUS | Status: AC
  Administered 2021-05-19: 2 g via INTRAVENOUS
  Filled 2021-05-19: qty 50

## 2021-05-19 MED ORDER — POTASSIUM PHOSPHATES 15 MMOLE/5ML IV SOLN: 20.0000 mmol | Freq: Once | INTRAVENOUS | Status: DC

## 2021-05-19 NOTE — Progress Notes (Signed)
NT report sat 82%. Upon arrival to the room the pt was sleeping. This RN aroused pt. Encouraged him to cough and deep breathe. Placed on 3l of 2. Sat improved from 87% on RA to 96-97% on O2. MD notified. Cont with plan of care

## 2021-05-19 NOTE — Progress Notes (Signed)
I received a message to see this patient who presented with back pain, weight loss. Upon further evaluation, he was found to have left lung mass with loculated effusion, invasion into the mediastinum, lytic bone lesions, RLL nodules and retroperitoneal lesion. Discussed with primary team, that we will need biopsies, preferably not a bony lesion to make the diagnosis and completion of staging. No intracranial mets. Once diagnosis is established, he may need further molecular testing based on the histology and we can discuss treatment recommendations. If there is concern for immediate respiratory compromise, can consider consulting radiation oncology for urgent radiation, although if its small cell, it could be very responsive to chemo as well. Please give [place] a call when biopsy results available.

## 2021-05-19 NOTE — Progress Notes (Signed)
PROGRESS NOTE    Bobby Pope  VHQ:[id number] DOB: 08-Sep-1956 DOA: 05/17/2021 PCP: Pcp, No   Brief Narrative:  The patient is a 65 year old thin chronically ill-appearing AAM who is a current smoker and has really seen physician and denies any other medical history who presented to the emergency room for evaluation of worsening back pain.  His sister also raise concern for unintentional weight loss for the last few months and patient does not know how much she lost with thinks is over 30 pounds.  He reported waxing and waning back pain without injury or inciting event involving bilateral lower back with no extremity numbness or weakness.  He denies any incontinence or saddle anesthesia but did report a loss of appetite and weight loss over several months.  He has an occasional nonproductive cough but did not feel shortness of breath.  Upon ED evaluation he was found to be afebrile and slightly tachypneic and EKG did show some ST elevations inferiorly but his high-sensitivity troponins were normal.  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  He was given IV fluids, Lasix, calcitonin and Decadron as well as Zometa in the ED.  CT surgery evaluated his scans and felt that he is not a surgical candidate and recommended pulmonary versus interventional radiology evaluation for image guided biopsy.  Recommendation from Dr. Kipp Brood was to obtain a PET CT scan and MRI of the brain.  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 -Smoker who denies PMHx but has not seen a physician in many years presents with worsening back pain and is found to have left lung mass with loculated effusion, invasion into mediastinum, lytic bone lesions, RLL nodules, and retroperitoneal lesion -Discussed with CT surgery who will review scans and he is currently not a surgical candidate -Discussed with oncology, will need biopsy and IR is planning on a LN biopsy with U/S and if unable to visualized they are recommending Bronch for Bx -MRI brain done for staging if small cell lung cancer; MRI showed "Motion degraded study, but without evidence of intracranial metastatic disease. Question abnormal marrow signal at the tip of the clivus and affecting the dens. Metastatic disease to the bones of these levels not excluded. Maxillary sinus opacification/inflammatory change, worse on the right than the left." -Consulted pulmonary and interventional radiology for biopsy; See Above but Pulmonary recommending accessing his loculated effusion for cytology and recommending a thoracentesis under ultrasound as soon as possible but have still added the patient to the list for a FOB 05/20/21 in the afternoon if Unable to get a LN Bx  Hypercalcemialikely of Malignancy  -Serum calcium 14.3 on admission and improved to 11.5 today  -Appears to be asymptomatic, likely related to his malignancy -Treated with Zometa, calcitonin, and IVF in ED -Continue volume expansion with IVF hydration  -Continue monitor and trend and repeat CMP in a.m.  Hypomagnesemia -Patient's Mag Level was 1.5 and improved to 1.8 this AM -Replete with  IV Mag Sulfate 2 grams yesterday -Continue to Monitor and Trend -Repeat Mag Level in the AM  Hypophosphatemia -Phosphorus level was 1.5 -Replete with IV sodium phosphate 20 mmol -Continue to monitor and replete as  necessary -Repeat phosphorus level in the a.m.  Hyperglycemia -Patient's blood sugar ranging from 90-114 -Likely reactive and also in the setting of steroid demargination -Check hemoglobin A1c  -Continue to monitor blood sugars per protocol and if necessary will add a sensitive NovoLog sliding scale insulin AC  DVT prophylaxis: SCDs Code Status: FULL CODE  Family Communication: No family present at bedside Disposition Plan: Pending further clinical Workup and evaluation by Specialists   Status is: Inpatient  Remains inpatient appropriate because:Unsafe d/c plan, IV treatments appropriate due to intensity of illness or inability to take PO and Inpatient level of care appropriate due to severity of illness   Dispo: The patient is from: Home              Anticipated d/c is to: TBD              Patient currently is not medically stable to d/c.   Difficult to place patient No  Consultants:   Cardiothroacic Surgery  Pulmonary  Interventional Radiology  Medical Oncology    Procedures:   Antimicrobials: Anti-infectives (From admission, onward)   None        Subjective: Seen and examined at bedside and he is doing fairly well sitting in the chair.  No nausea or vomiting.  Denies any lightheadedness or dizziness.  Still has some back pain.  No other concerns or complaints at this time.  Objective: Vitals:   05/18/21 1935 05/18/21 2050 05/19/21 0500 05/19/21 0529  BP: 123/75 123/75  127/82  Pulse: 92 92  89  Resp: (!) 22 (!) 22  16  Temp: 99 F (37.2 C) 99 F (37.2 C)  99.3 F (37.4 C)  TempSrc: Oral Oral  Oral  SpO2: 98% 98%  96%  Weight:  64.9 kg 61.8 kg   Height:  5\' 9"  (1.753 m)      Intake/Output Summary (Last 24 hours) at 05/19/2021 1144 Last data filed at 05/19/2021 1000 Gross per 24 hour  Intake 843.34 ml  Output --  Net 843.34 ml   Filed Weights   05/17/21 2058 05/18/21 2050 05/19/21 0500  Weight: 64.9 kg 64.9 kg 61.8 kg   Examination: Physical  Exam:  Constitutional: Thin cachectic chronically ill-appearing African-American male in NAD Eyes: Lids and conjunctivae normal, sclerae anicteric  ENMT: External Ears, Nose appear normal. Grossly normal hearing. Poor Dentition Neck: Appears normal, supple, no cervical masses, normal ROM, no appreciable thyromegaly; no JVD Respiratory: Diminished to auscultation bilaterally, no wheezing, rales, rhonchi or crackles. Normal respiratory effort and patient is not tachypenic. No accessory muscle use.  Unlabored breathing Cardiovascular: RRR, no murmurs / rubs / gallops. S1 and S2 auscultated. No extremity edema.  Abdomen: Soft, non-tender, non-distended. Bowel sounds positive.  GU: Deferred. Musculoskeletal: No clubbing / cyanosis of digits/nails. No joint deformity upper and lower extremities.  Skin: No rashes, lesions, ulcers on limited skin evaluation. No induration; Warm and dry.  Neurologic: CN 2-12 grossly intact with no focal deficits. Romberg sign and cerebellar reflexes not assessed.  Psychiatric: Normal judgment and insight. Alert and oriented x 3. Normal mood and appropriate affect.   Data Reviewed: I have personally reviewed following labs and imaging studies  CBC: Recent Labs  Lab 05/17/21 2327 05/18/21 0850 05/19/21 0243  WBC 9.7 9.3 7.1  NEUTROABS 8.1* 7.8* 5.7  HGB 14.0 13.7 13.2  HCT 44.4 42.5 42.0  MCV 92.3 91.6 93.3  PLT 366 335 948   Basic Metabolic Panel: Recent Labs  Lab 05/17/21 2327 05/18/21 0850 05/19/21 0243  NA 137 138 138  K 4.2 4.6 4.2  CL 98 97* 100  CO2 27 28 28   GLUCOSE 114* 109* 90  BUN 42* 33* 30*  CREATININE 0.92 0.83 0.82  CALCIUM 14.3* 13.3* 11.5*  MG 1.5* 1.5* 1.8  PHOS  --  3.1 1.5*   GFR: Estimated Creatinine Clearance: 79.6 mL/min (by C-G formula based on SCr of 0.82 mg/dL). Liver Function Tests: Recent Labs  Lab 05/17/21 2327 05/18/21 0850 05/19/21 0243  AST 18 19 16   ALT 12 11 10   ALKPHOS 88 81 77  BILITOT 0.8 0.9 0.6   PROT 7.3 7.1 6.8  ALBUMIN 3.7 3.4* 3.1*   No results for input(s): LIPASE, AMYLASE in the last 168 hours. No results for input(s): AMMONIA in the last 168 hours. Coagulation Profile: No results for input(s): INR, PROTIME in the last 168 hours. Cardiac Enzymes: No results for input(s): CKTOTAL, CKMB, CKMBINDEX, TROPONINI in the last 168 hours. BNP (last 3 results) No results for input(s): PROBNP in the last 8760 hours. HbA1C: No results for input(s): HGBA1C in the last 72 hours. CBG: No results for input(s): GLUCAP in the last 168 hours. Lipid Profile: No results for input(s): CHOL, HDL, LDLCALC, TRIG, CHOLHDL, LDLDIRECT in the last 72 hours. Thyroid Function Tests: No results for input(s): TSH, T4TOTAL, FREET4, T3FREE, THYROIDAB in the last 72 hours. Anemia Panel: No results for input(s): VITAMINB12, FOLATE, FERRITIN, TIBC, IRON, RETICCTPCT in the last 72 hours. Sepsis Labs: No results for input(s): PROCALCITON, LATICACIDVEN in the last 168 hours.  Recent Results (from the past 240 hour(s))  Resp Panel by RT-PCR (Flu A&B, Covid) Nasopharyngeal Swab     Status: None   Collection Time: 05/18/21 12:18 AM   Specimen: Nasopharyngeal Swab; Nasopharyngeal(NP) swabs in vial transport medium  Result Value Ref Range Status   SARS Coronavirus 2 by RT PCR NEGATIVE NEGATIVE Final    Comment: (NOTE) SARS-CoV-2 target nucleic acids are NOT DETECTED.  The SARS-CoV-2 RNA is generally detectable in upper respiratory specimens during the acute phase of infection. The lowest concentration of SARS-CoV-2 viral copies this assay can detect is 138 copies/mL. A negative result does not preclude SARS-Cov-2 infection and should not be used as the sole basis for treatment or other patient management decisions. A negative result may occur with  improper specimen collection/handling, submission of specimen other than nasopharyngeal swab, presence of viral mutation(s) within the areas targeted by this  assay, and inadequate number of viral copies(<138 copies/mL). A negative result must be combined with clinical observations, patient history, and epidemiological information. The expected result is Negative.  Fact Sheet for Patients:  EntrepreneurPulse.com.au  Fact Sheet for Healthcare Providers:  IncredibleEmployment.be  This test is no t yet approved or cleared by the [place] FDA and  has been authorized for detection and/or diagnosis of SARS-CoV-2 by FDA under an Emergency Use Authorization (EUA). This EUA will remain  in effect (meaning this test can be used) for the duration of the COVID-19 declaration under Section 564(b)(1) of the Act, 21 U.S.C.section 360bbb-3(b)(1), unless the authorization is terminated  or revoked sooner.       Influenza A by PCR NEGATIVE NEGATIVE Final   Influenza B by PCR NEGATIVE NEGATIVE Final    Comment: (NOTE) The Xpert  Xpress SARS-CoV-2/FLU/RSV plus assay is intended as an aid in the diagnosis of influenza from Nasopharyngeal swab specimens and should not be used as a sole basis for treatment. Nasal washings and aspirates are unacceptable for Xpert Xpress SARS-CoV-2/FLU/RSV testing.  Fact Sheet for Patients: EntrepreneurPulse.com.au  Fact Sheet for Healthcare Providers: IncredibleEmployment.be  This test is not yet approved or cleared by the [place] FDA and has been authorized for detection and/or diagnosis of SARS-CoV-2 by FDA under an Emergency Use Authorization (EUA). This EUA will remain in effect (meaning this test can be used) for the duration of the COVID-19 declaration under Section 564(b)(1) of the Act, 21 U.S.C. section 360bbb-3(b)(1), unless the authorization is terminated or revoked.  Performed at [facility], [place] [address] [place],  [address]     RN Pressure Injury Documentation:     Estimated body mass index  is 20.13 kg/m as calculated from the following:   Height as of this encounter: 5\' 9"  (1.753 m).   Weight as of this encounter: 61.8 kg.  Malnutrition Type:   Malnutrition Characteristics:   Nutrition Interventions:    Radiology Studies: DG Chest 2 View  Result Date: 05/17/2021 CLINICAL DATA:  Lower back pain, status post fall. EXAM: CHEST - 2 VIEW COMPARISON:  None. FINDINGS: There is complete opacification of the left hemithorax with a very small amount of aerated lung noted within the left lung base. The right lung is mildly hyperinflated and otherwise clear. Right to left shift of the cardiac silhouette is noted which is subsequently limited in evaluation. There is marked severity calcification of the thoracic aorta. Ill-defined deformities of the third, sixth and seventh left ribs are seen. IMPRESSION: Findings likely consistent with partial collapse of the left lung with associated atelectasis, infiltrate and pleural effusion. Correlation with chest CT is recommended, as an underlying mass cannot be excluded. Electronically Signed   By: Virgina Norfolk M.D.   On: 05/17/2021 23:28   DG Lumbar Spine Complete  Result Date: 05/17/2021 CLINICAL DATA:  Status post fall with lower back pain. EXAM: LUMBAR SPINE - COMPLETE 4+ VIEW COMPARISON:  None. FINDINGS: There is no evidence of acute lumbar spine fracture. Alignment is normal. Mild multilevel endplate sclerosis is seen. Intervertebral disc spaces are maintained. Moderate severity calcification of the abdominal aorta is noted. IMPRESSION: Multilevel degenerative changes without an acute lumbar spine fracture or subluxation. Electronically Signed   By: Virgina Norfolk M.D.   On: 05/17/2021 23:33   MR BRAIN W WO CONTRAST  Result Date: 05/18/2021 CLINICAL DATA:  Left chest malignancy.  Staging. EXAM: MRI HEAD WITHOUT AND WITH CONTRAST TECHNIQUE: Multiplanar, multiecho pulse sequences of the brain and surrounding structures were obtained without  and with intravenous contrast. CONTRAST:  75mL GADAVIST GADOBUTROL 1 MMOL/ML IV SOLN COMPARISON:  None. FINDINGS: Brain: The brain itself has a normal appearance without evidence atrophy, old or acute small or large vessel infarction, primary or metastatic mass lesion, hemorrhage, hydrocephalus or extra-axial collection. No abnormal contrast enhancement occurs. Vascular: Major vessels at the base of the brain show flow. Skull and upper cervical spine: Question abnormal signal in the tip of the clivus in the dens. Osseous metastatic disease not excluded. Sinuses/Orbits: Opacified maxillary sinuses, more extensively on the right than the left. Orbits negative. Other: Frontal scalp lipoma. IMPRESSION: Motion degraded study, but without evidence of intracranial metastatic disease. Question abnormal marrow signal at the tip of the clivus and affecting the dens. Metastatic disease to the bones of these levels  not excluded. Maxillary sinus opacification/inflammatory change, worse on the right than the left. Electronically Signed   By: Nelson Chimes M.D.   On: 05/18/2021 17:19   CT CHEST ABDOMEN PELVIS W CONTRAST  Result Date: 05/18/2021 CLINICAL DATA:  Lower back pain status post fall. Partial collapse of left lung with associated infiltrate and pleural effusion. Question underlying mass on chest x-ray. EXAM: CT CHEST, ABDOMEN, AND PELVIS WITH CONTRAST CT LUMBAR SPINE WITHOUT CONTRAST TECHNIQUE: Multidetector CT imaging of the chest, abdomen and pelvis was performed following the standard protocol during bolus administration of intravenous contrast. Multidetector CT imaging of the lumbar spine was performed without intravenous contrast administration. Multiplanar CT image reconstructions were also generated. CONTRAST:  123mL OMNIPAQUE IOHEXOL 300 MG/ML  SOLN COMPARISON:  Chest x-ray 05/17/2021 FINDINGS: CT CHEST FINDINGS Cardiovascular: Normal heart size. No significant pericardial effusion. The thoracic aorta is normal  in caliber. Mild-to-moderate atherosclerotic plaque of the thoracic aorta. At least 3 vessel coronary artery calcifications. The main pulmonary artery is enlarged in caliber measuring up to 3.7 cm. No central or proximal segmental pulmonary embolus. Mediastinum/Nodes: Mediastinal lymphadenopathy with as an example a 1.5 cm precarinal lymph node (2:27). Also noted subcarinal lymph node measuring 1.6 cm (2:33). No definite right hilar lymphadenopathy. Left hilar lymphadenopathy unable to measure. Lungs/Pleura: Almost complete heterogeneous opacification of the left hemithorax with associated pleural thickening and nodularity. Underlying pulmonary mass is difficult to measure. Invasion into the mediastinum is noted. Associated narrowing of the left main pulmonary artery as well as left mainstem bronchus. Associated loculated left pleural effusion. Several right lower lobe pulmonary nodule measuring up to 0.9 x 0.5 cm (4:121). Calcified pulmonary nodule within the right upper lobe. Pulmonary micronodule within the right upper lobe (4:35). Debris within the right mainstem bronchus and trachea. As well as within the right lower lobe bronchials. Musculoskeletal: Scattered axial and appendicular skeleton lytic lesions. Question pathologic nondisplaced fracture of the right posterior tenth rib (2:49). CT ABDOMEN PELVIS FINDINGS Hepatobiliary: No focal liver abnormality. No gallstones, gallbladder wall thickening, or pericholecystic fluid. No biliary dilatation. Pancreas: No focal lesion. Normal pancreatic contour. No surrounding inflammatory changes. No main pancreatic ductal dilatation. Spleen: Normal in size without focal abnormality. Adrenals/Urinary Tract: No right adrenal nodule. There is a retroperitoneal left upper 3.3 x 2.1 cm lobulated lesion that may be arising from the left adrenal gland. Bilateral kidneys enhance symmetrically. No hydronephrosis. No hydroureter. The urinary bladder is unremarkable. Stomach/Bowel:  Stomach is within normal limits. No evidence of bowel wall thickening or dilatation. Appendix appears normal. Vascular/Lymphatic: No abdominal aorta or iliac aneurysm. Moderate atherosclerotic plaque of the aorta and its branches. No pelvic or inguinal lymphadenopathy. Reproductive: The prostate is enlarged measuring up to 5.1 cm. Other: No intraperitoneal free fluid. No intraperitoneal free gas. No organized fluid collection. Musculoskeletal: Scattered axial and appendicular skeleton lytic lesions. CT LUMBAR SPINE: Segmentation: 5 non-rib-bearing lumbar vertebral bodies. Alignment: Normal. Vertebra: Several lytic lesions with cortical destruction, as an example: Posterior wall and inferior endplate of the L4 level (504:22), right iliac bone (3:97), S1 level (504:22, 3:101). Soft tissues: Question associated soft tissue densities along the lytic lesions with as an example along the right iliac bone (2:91). IMPRESSION: 1. 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 Underlying pulmonary mass difficult to measure/visualize. Associated loculated left pleural effusion. Invasion into the mediastinum with associated narrowing of the left main pulmonary artery as well as left mainstem bronchus. 2. Mediastinal and left hilar lymphadenopathy. 3. Several right  lower lobe pulmonary nodules measuring up to 0.9 x 0.5 cm. Findings could represent metastases versus infection/inflammation given debris within the right mainstem bronchus and trachea as well as within the right lower lobe bronchioles with associated right lower lobe mucous plugging. 4. Axial and appendicular skeleton lytic lesions with cortical destruction consistent with metastases. 5. Question pathologic nondisplaced fracture of the right posterior tenth rib. 6. A left retroperitoneal 3.3 x 2.1 cm lobulated lesion may be arising from the left adrenal gland versus represent a lymph node. Finding likely  metastasis. 7. Prostatomegaly. Electronically Signed   By: Iven Finn M.D.   On: 05/18/2021 02:05   CT L-SPINE NO CHARGE  Result Date: 05/18/2021 CLINICAL DATA:  Lower back pain status post fall. Partial collapse of left lung with associated infiltrate and pleural effusion. Question underlying mass on chest x-ray. EXAM: CT CHEST, ABDOMEN, AND PELVIS WITH CONTRAST CT LUMBAR SPINE WITHOUT CONTRAST TECHNIQUE: Multidetector CT imaging of the chest, abdomen and pelvis was performed following the standard protocol during bolus administration of intravenous contrast. Multidetector CT imaging of the lumbar spine was performed without intravenous contrast administration. Multiplanar CT image reconstructions were also generated. CONTRAST:  112mL OMNIPAQUE IOHEXOL 300 MG/ML  SOLN COMPARISON:  Chest x-ray 05/17/2021 FINDINGS: CT CHEST FINDINGS Cardiovascular: Normal heart size. No significant pericardial effusion. The thoracic aorta is normal in caliber. Mild-to-moderate atherosclerotic plaque of the thoracic aorta. At least 3 vessel coronary artery calcifications. The main pulmonary artery is enlarged in caliber measuring up to 3.7 cm. No central or proximal segmental pulmonary embolus. Mediastinum/Nodes: Mediastinal lymphadenopathy with as an example a 1.5 cm precarinal lymph node (2:27). Also noted subcarinal lymph node measuring 1.6 cm (2:33). No definite right hilar lymphadenopathy. Left hilar lymphadenopathy unable to measure. Lungs/Pleura: Almost complete heterogeneous opacification of the left hemithorax with associated pleural thickening and nodularity. Underlying pulmonary mass is difficult to measure. Invasion into the mediastinum is noted. Associated narrowing of the left main pulmonary artery as well as left mainstem bronchus. Associated loculated left pleural effusion. Several right lower lobe pulmonary nodule measuring up to 0.9 x 0.5 cm (4:121). Calcified pulmonary nodule within the right upper lobe.  Pulmonary micronodule within the right upper lobe (4:35). Debris within the right mainstem bronchus and trachea. As well as within the right lower lobe bronchials. Musculoskeletal: Scattered axial and appendicular skeleton lytic lesions. Question pathologic nondisplaced fracture of the right posterior tenth rib (2:49). CT ABDOMEN PELVIS FINDINGS Hepatobiliary: No focal liver abnormality. No gallstones, gallbladder wall thickening, or pericholecystic fluid. No biliary dilatation. Pancreas: No focal lesion. Normal pancreatic contour. No surrounding inflammatory changes. No main pancreatic ductal dilatation. Spleen: Normal in size without focal abnormality. Adrenals/Urinary Tract: No right adrenal nodule. There is a retroperitoneal left upper 3.3 x 2.1 cm lobulated lesion that may be arising from the left adrenal gland. Bilateral kidneys enhance symmetrically. No hydronephrosis. No hydroureter. The urinary bladder is unremarkable. Stomach/Bowel: Stomach is within normal limits. No evidence of bowel wall thickening or dilatation. Appendix appears normal. Vascular/Lymphatic: No abdominal aorta or iliac aneurysm. Moderate atherosclerotic plaque of the aorta and its branches. No pelvic or inguinal lymphadenopathy. Reproductive: The prostate is enlarged measuring up to 5.1 cm. Other: No intraperitoneal free fluid. No intraperitoneal free gas. No organized fluid collection. Musculoskeletal: Scattered axial and appendicular skeleton lytic lesions. CT LUMBAR SPINE: Segmentation: 5 non-rib-bearing lumbar vertebral bodies. Alignment: Normal. Vertebra: Several lytic lesions with cortical destruction, as an example: Posterior wall and inferior endplate of the L4 level (504:22), right iliac  bone (3:97), S1 level (504:22, 3:101). Soft tissues: Question associated soft tissue densities along the lytic lesions with as an example along the right iliac bone (2:91). IMPRESSION: 1. Left hemithorax malignancy with almost complete  opacification and associated pleural thickening/nodularity. Underlying pulmonary mass difficult to measure/visualize. Associated loculated left pleural effusion. Invasion into the mediastinum with associated narrowing of the left main pulmonary artery as well as left mainstem bronchus. 2. Mediastinal and left hilar lymphadenopathy. 3. Several right lower lobe pulmonary nodules measuring up to 0.9 x 0.5 cm. Findings could represent metastases versus infection/inflammation given debris within the right mainstem bronchus and trachea as well as within the right lower lobe bronchioles with associated right lower lobe mucous plugging. 4. Axial and appendicular skeleton lytic lesions with cortical destruction consistent with metastases. 5. Question pathologic nondisplaced fracture of the right posterior tenth rib. 6. A left retroperitoneal 3.3 x 2.1 cm lobulated lesion may be arising from the left adrenal gland versus represent a lymph node. Finding likely metastasis. 7. Prostatomegaly. Electronically Signed   By: Iven Finn M.D.   On: 05/18/2021 02:05   Scheduled Meds: . feeding supplement  237 mL Oral BID BM   Continuous Infusions: . magnesium sulfate bolus IVPB 2 g (05/19/21 1058)  . methocarbamol (ROBAXIN) IV    . sodium phosphate  Dextrose 5% IVPB 20 mmol (05/19/21 1102)    LOS: 1 day    Kerney Elbe, DO [facility] PAGER is on [place]  If 7PM-7AM, please contact night-coverage [email or web address]

## 2021-05-20 ENCOUNTER — Encounter (HOSPITAL_COMMUNITY)
Admission: EM | Disposition: A | Discharge status: Home / Self Care | Payer: Self-pay | Source: Home / Self Care | Attending: Internal Medicine

## 2021-05-20 ENCOUNTER — Inpatient Hospital Stay (HOSPITAL_COMMUNITY): Payer: Medicaid Other

## 2021-05-20 ENCOUNTER — Encounter (HOSPITAL_COMMUNITY): Payer: Self-pay | Admitting: Family Medicine

## 2021-05-20 DIAGNOSIS — E43 Unspecified severe protein-calorie malnutrition: Secondary | ICD-10-CM

## 2021-05-20 DIAGNOSIS — J90 Pleural effusion, not elsewhere classified: Secondary | ICD-10-CM

## 2021-05-20 LAB — CBC WITH DIFFERENTIAL/PLATELET
Abs Immature Granulocytes: 0.02 10*3/uL (ref 0.00–0.07)
Basophils Absolute: 0 10*3/uL (ref 0.0–0.1)
Basophils Relative: 0 %
Eosinophils Absolute: 0 10*3/uL (ref 0.0–0.5)
Eosinophils Relative: 0 %
HCT: 38.4 % — ABNORMAL LOW (ref 39.0–52.0)
Hemoglobin: 12 g/dL — ABNORMAL LOW (ref 13.0–17.0)
Immature Granulocytes: 0 %
Lymphocytes Relative: 8 %
Lymphs Abs: 0.6 10*3/uL — ABNORMAL LOW (ref 0.7–4.0)
MCH: 29.3 pg (ref 26.0–34.0)
MCHC: 31.3 g/dL (ref 30.0–36.0)
MCV: 93.9 fL (ref 80.0–100.0)
Monocytes Absolute: 0.8 10*3/uL (ref 0.1–1.0)
Monocytes Relative: 12 %
Neutro Abs: 5.8 10*3/uL (ref 1.7–7.7)
Neutrophils Relative %: 80 %
Platelets: 274 10*3/uL (ref 150–400)
RBC: 4.09 MIL/uL — ABNORMAL LOW (ref 4.22–5.81)
RDW: 15.2 % (ref 11.5–15.5)
WBC: 7.2 10*3/uL (ref 4.0–10.5)
nRBC: 0 % (ref 0.0–0.2)

## 2021-05-20 LAB — COMPREHENSIVE METABOLIC PANEL
ALT: 10 U/L (ref 0–44)
AST: 12 U/L — ABNORMAL LOW (ref 15–41)
Albumin: 2.9 g/dL — ABNORMAL LOW (ref 3.5–5.0)
Alkaline Phosphatase: 65 U/L (ref 38–126)
Anion gap: 9 (ref 5–15)
BUN: 21 mg/dL (ref 8–23)
CO2: 27 mmol/L (ref 22–32)
Calcium: 9.9 mg/dL (ref 8.9–10.3)
Chloride: 103 mmol/L (ref 98–111)
Creatinine, Ser: 0.56 mg/dL — ABNORMAL LOW (ref 0.61–1.24)
GFR, Estimated: >60 mL/min (ref >60)
Glucose, Bld: 84 mg/dL (ref 70–99)
Potassium: 3.8 mmol/L (ref 3.5–5.1)
Sodium: 139 mmol/L (ref 135–145)
Total Bilirubin: 0.8 mg/dL (ref 0.3–1.2)
Total Protein: 6.1 g/dL — ABNORMAL LOW (ref 6.5–8.1)

## 2021-05-20 LAB — MAGNESIUM: Magnesium: 1.9 mg/dL (ref 1.7–2.4)

## 2021-05-20 LAB — LACTATE DEHYDROGENASE: LDH: 218 U/L — ABNORMAL HIGH (ref 98–192)

## 2021-05-20 LAB — PHOSPHORUS: Phosphorus: 1.7 mg/dL — ABNORMAL LOW (ref 2.5–4.6)

## 2021-05-20 SURGERY — CANCELLED PROCEDURE

## 2021-05-20 MED ORDER — MIDAZOLAM HCL 2 MG/2ML IJ SOLN
INTRAMUSCULAR | Status: AC | PRN
  Administered 2021-05-20: 1 mg via INTRAVENOUS

## 2021-05-20 MED ORDER — GUAIFENESIN-DM 100-10 MG/5ML PO SYRP
5.0000 mL | ORAL_SOLUTION | ORAL | Status: DC | PRN
  Administered 2021-05-20 – 2021-05-21 (×2): 5 mL via ORAL
  Filled 2021-05-20 (×2): qty 10

## 2021-05-20 MED ORDER — POTASSIUM PHOSPHATES 15 MMOLE/5ML IV SOLN
20.0000 mmol | Freq: Once | INTRAVENOUS | Status: AC
  Administered 2021-05-20: 20 mmol via INTRAVENOUS
  Filled 2021-05-20: qty 6.67

## 2021-05-20 MED ORDER — MENTHOL 3 MG MT LOZG
1.0000 | LOZENGE | OROMUCOSAL | Status: DC | PRN
  Administered 2021-05-21: 3 mg via ORAL
  Filled 2021-05-20: qty 9

## 2021-05-20 MED ORDER — LIDOCAINE HCL (PF) 1 % IJ SOLN
INTRAMUSCULAR | Status: AC | PRN
  Administered 2021-05-20: 10 mL

## 2021-05-20 MED ORDER — FENTANYL CITRATE (PF) 100 MCG/2ML IJ SOLN
INTRAMUSCULAR | Status: AC | PRN
  Administered 2021-05-20: 50 ug via INTRAVENOUS

## 2021-05-20 MED ORDER — FENTANYL CITRATE (PF) 100 MCG/2ML IJ SOLN
INTRAMUSCULAR | Status: AC
  Filled 2021-05-20: qty 2

## 2021-05-20 MED ORDER — ENSURE ENLIVE PO LIQD
237.0000 mL | Freq: Two times a day (BID) | ORAL | Status: DC
  Administered 2021-05-20 – 2021-05-23 (×6): 237 mL via ORAL

## 2021-05-20 MED ORDER — MIDAZOLAM HCL 2 MG/2ML IJ SOLN
INTRAMUSCULAR | Status: AC
  Filled 2021-05-20: qty 4

## 2021-05-20 NOTE — Progress Notes (Signed)
Patient ID: Bobby Pope, male   DOB: 03-20-1956, 65 y.o.   MRN: [id number] Pt tent scheduled for image guided lymph node vs lytic bone lesion bx following review of imaging by Dr. Laurence Ferrari this am. Plans d/w pt.

## 2021-05-20 NOTE — Procedures (Signed)
Interventional Radiology Procedure Note  Procedure: CT guided core biopsy of bone lesion, right iliac  Complications: None  Estimated Blood Loss: None  Recommendations: - Return to room - Path sent   Signed,  Criselda Peaches, MD

## 2021-05-20 NOTE — Progress Notes (Signed)
Thoracentesis cancelled. MD unable to find fluid pocket on ultrasound. Pt aware MD explained next steps with patient, he verbalizes understanding. Pt returning to room 1425,

## 2021-05-20 NOTE — Progress Notes (Signed)
PROGRESS NOTE    Bobby Pope  GYI:[id number] DOB: 04/24/1956 DOA: 05/17/2021 PCP: Pcp, No   Brief Narrative:  The patient is a 65 year old thin chronically ill-appearing AAM who is a current smoker and has really seen physician and denies any other medical history who presented to the emergency room for evaluation of worsening back pain.  His sister also raise concern for unintentional weight loss for the last few months and patient does not know how much she lost with thinks is over 30 pounds.  He reported waxing and waning back pain without injury or inciting event involving bilateral lower back with no extremity numbness or weakness.  He denies any incontinence or saddle anesthesia but did report a loss of appetite and weight loss over several months.  He has an occasional nonproductive cough but did not feel shortness of breath.  Upon ED evaluation he was found to be afebrile and slightly tachypneic and EKG did show some ST elevations inferiorly but his high-sensitivity troponins were normal.  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  He was given IV fluids, Lasix, calcitonin and Decadron as well as Zometa in the ED.  CT surgery evaluated his scans and felt that he is not a surgical candidate and recommended pulmonary versus interventional radiology evaluation for image guided biopsy.  Recommendation from Dr. Kipp Brood was to obtain a PET CT scan and MRI of the brain.  We will obtain an MRI of the brain here and we have consulted pulmonary and interventional radiology for further evaluation and assistance  MRI Brain done and showed "Motion degraded study, but without evidence of intracranial  metastatic disease. Question abnormal marrow signal at the tip of the clivus and affecting the dens. Metastatic disease to the bones of these levels not excluded. Maxillary sinus opacification/inflammatory change, worse on the right than the left." Patient unable to get LN Bx so instead got a  CT Guided Core Bx of the Right Bone Lesion. 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 -Smoker who denies PMHx but has not seen a physician in many years presents with worsening back pain and is found to have left lung mass with loculated effusion, invasion into mediastinum, lytic bone lesions, RLL nodules, and retroperitoneal lesion -Discussed with CT surgery who will review scans and he is currently not a surgical candidate -Discussed with oncology, will need biopsy and IR is planning on a LN biopsy with U/S and if unable to visualized they are recommending Bronch for Bx -MRI brain done for staging if small cell lung cancer; MRI showed "Motion degraded study, but without evidence of intracranial metastatic disease. Question abnormal marrow signal at the tip of the clivus and affecting the dens. Metastatic disease to the bones of these levels not excluded. Maxillary sinus opacification/inflammatory change, worse on the right than the left." -Consulted pulmonary and interventional radiology for biopsy; See Above but Pulmonary recommending accessing his loculated effusion for cytology and recommending  a thoracentesis under ultrasound as soon as possible but have still added the patient to the list for a FOB 05/20/21 in the afternoon if Unable to get a LN Bx -LN Biopsy not able to be done so CT Guided Bx done of the  Bone Lesion in the Right iliac  -Pulmonary planning on doing Thoracentesis and possible FOB for Bx -Continue follow Bx Results and Fluid Analysis; Will need Tissue Biopsy per Oncology for Molecular Testing   Acute Respiratory Failure with Hypoxia -In the Setting of Above -IVF now Stopped -SpO2: 94 % O2 Flow Rate (L/min): 3 L/min -Now off of Supplemental O2 -Will get a Thoracentesis -Continue Supplemental O2 via [place] and [place] as Tolerated -Continuous Pulse Oximetry and Maintain O2 Saturations >92% -Repeat CX in the AM and Follow Pulmonary Recc's  Normocytic Anemia -Patient's Hgb/Hct has been trending down and went from 13.7/42.5 -> 13.2/42.0 -> 12.0/38.4 and ? Related to Malignancy of Dilutional Drop -Check Anemia Panel in the AM  -Continue to Monitor S/Sx of Bleeding; No overt bleeding in the AM -Repeat CBC in the AM   Hypercalcemialikely of Malignancy  -Serum calcium 14.3 on admission and improved to 9.9 today  -Appears to be asymptomatic, likely related to his malignancy -Treated with Zometa, calcitonin, and IVF in ED -Continued volume expansion with IVF hydration but will now stop  -Continue monitor and trend and repeat CMP in a.m.  Hypomagnesemia -Patient's Mag Level is now 1.9 -Continue to Monitor and Trend -Repeat Mag Level in the AM  Hypophosphatemia -Phosphorus level was 1.5 and went to 1.7 -Replete with IV K Phos 20 mmol -Continue to monitor and replete as necessary -Repeat phosphorus level in the a.m.  Hyperglycemia -Patient's blood sugar ranging from 90-114 -Likely reactive and also in the setting of steroid demargination -Check hemoglobin A1c  -Continue to monitor blood sugars per protocol and if necessary will add a sensitive NovoLog sliding scale insulin AC  Severe Malnutrition in the Context of Acute Illness/Injury -Nutritionist Consulted for further evaluation and recommendations  -C/w Ensure Enlive po BID   DVT prophylaxis: SCDs Code Status:  FULL CODE  Family Communication: Discussed with Family at bedside  Disposition Plan: Pending further clinical Workup and evaluation by Specialists   Status is: Inpatient  Remains inpatient appropriate because:Unsafe d/c plan, IV treatments appropriate due to intensity of illness or inability to take PO and Inpatient level of care appropriate due to severity of illness   Dispo: The patient is from: Home              Anticipated d/c is to: TBD              Patient currently is not medically stable to d/c.   Difficult to place patient No  Consultants:   Cardiothroacic Surgery  Pulmonary  Interventional Radiology  Medical Oncology    Procedures:   Antimicrobials: Anti-infectives (From admission, onward)   None        Subjective: Seen and examined at bedside and had no complaints or pain.  Felt relatively well.  No nausea or vomiting.  Became short of breath last night and had to be placed on oxygen but now off of it.  No other concerns or complaints at this time and awaiting for his studies and biopsies.  Objective: Vitals:   05/20/21 1330 05/20/21 1350 05/20/21 1425 05/20/21 1507  BP: (!) 145/88 (!) 147/93 (!) 150/91 (!) 139/91  Pulse: 78 74 82 80  Resp: (!) 24 (!) 22 20  Temp: 98.6 F (37 C) 98.7 F (37.1 C) 98.7 F (37.1 C) 98.7 F (37.1 C)  TempSrc: Oral Oral Oral Oral  SpO2: 100% 100% 97% 94%  Weight:      Height:        Intake/Output Summary (Last 24 hours) at 05/20/2021 1509 Last data filed at 05/20/2021 1500 Gross per 24 hour  Intake 1451 ml  Output 300 ml  Net 1151 ml   Filed Weights   05/18/21 2050 05/19/21 0500 05/20/21 0500  Weight: 64.9 kg 61.8 kg 64.5 kg   Examination: Physical Exam:  Constitutional: Thin Cachetic Chronically ill-appearing AAM in NAD Eyes: Lids and conjunctivae normal, sclerae anicteric  ENMT: External Ears, Nose appear normal. Grossly normal hearing. Poor Dentition Neck: Appears normal, supple, no cervical masses, normal  ROM, no appreciable thyromegaly; no JVD Respiratory: Diminished to auscultation bilaterally, no wheezing, rales, rhonchi or crackles. Normal respiratory effort and patient is not tachypenic. No accessory muscle use. Unlabored breathing  Cardiovascular: RRR, no murmurs / rubs / gallops. S1 and S2 auscultated. No extremity edema. Abdomen: Soft, non-tender, non-distended. Bowel sounds positive.  GU: Deferred. Musculoskeletal: No clubbing / cyanosis of digits/nails. No joint deformity upper and lower extremities.  Skin: No rashes, lesions, ulcers on a limited skin evaluation. No induration; Warm and dry.  Neurologic: CN 2-12 grossly intact with no focal deficits. Romberg sign and cerebellar reflexes not assessed.  Psychiatric: Normal judgment and insight. Alert and oriented x 3. Normal mood and appropriate affect.   Data Reviewed: I have personally reviewed following labs and imaging studies  CBC: Recent Labs  Lab 05/17/21 2327 05/18/21 0850 05/19/21 0243 05/20/21 0335  WBC 9.7 9.3 7.1 7.2  NEUTROABS 8.1* 7.8* 5.7 5.8  HGB 14.0 13.7 13.2 12.0*  HCT 44.4 42.5 42.0 38.4*  MCV 92.3 91.6 93.3 93.9  PLT 366 335 332 737   Basic Metabolic Panel: Recent Labs  Lab 05/17/21 2327 05/18/21 0850 05/19/21 0243 05/20/21 0335  NA 137 138 138 139  K 4.2 4.6 4.2 3.8  CL 98 97* 100 103  CO2 27 28 28 27   GLUCOSE 114* 109* 90 84  BUN 42* 33* 30* 21  CREATININE 0.92 0.83 0.82 0.56*  CALCIUM 14.3* 13.3* 11.5* 9.9  MG 1.5* 1.5* 1.8 1.9  PHOS  --  3.1 1.5* 1.7*   GFR: Estimated Creatinine Clearance: 85.1 mL/min (A) (by C-G formula based on SCr of 0.56 mg/dL (L)). Liver Function Tests: Recent Labs  Lab 05/17/21 2327 05/18/21 0850 05/19/21 0243 05/20/21 0335  AST 18 19 16  12*  ALT 12 11 10 10   ALKPHOS 88 81 77 65  BILITOT 0.8 0.9 0.6 0.8  PROT 7.3 7.1 6.8 6.1*  ALBUMIN 3.7 3.4* 3.1* 2.9*   No results for input(s): LIPASE, AMYLASE in the last 168 hours. No results for input(s): AMMONIA  in the last 168 hours. Coagulation Profile: No results for input(s): INR, PROTIME in the last 168 hours. Cardiac Enzymes: No results for input(s): CKTOTAL, CKMB, CKMBINDEX, TROPONINI in the last 168 hours. BNP (last 3 results) No results for input(s): PROBNP in the last 8760 hours. HbA1C: No results for input(s): HGBA1C in the last 72 hours. CBG: No results for input(s): GLUCAP in the last 168 hours. Lipid Profile: No results for input(s): CHOL, HDL, LDLCALC, TRIG, CHOLHDL, LDLDIRECT in the last 72 hours. Thyroid Function Tests: No results for input(s): TSH, T4TOTAL, FREET4, T3FREE, THYROIDAB in the last 72 hours. Anemia Panel: No results for input(s): VITAMINB12, FOLATE, FERRITIN, TIBC,  IRON, RETICCTPCT in the last 72 hours. Sepsis Labs: No results for input(s): PROCALCITON, LATICACIDVEN in the last 168 hours.  Recent Results (from the past 240 hour(s))  Resp Panel by RT-PCR (Flu A&B, Covid) Nasopharyngeal Swab     Status: None   Collection Time: 05/18/21 12:18 AM   Specimen: Nasopharyngeal Swab; Nasopharyngeal(NP) swabs in vial transport medium  Result Value Ref Range Status   SARS Coronavirus 2 by RT PCR NEGATIVE NEGATIVE Final    Comment: (NOTE) SARS-CoV-2 target nucleic acids are NOT DETECTED.  The SARS-CoV-2 RNA is generally detectable in upper respiratory specimens during the acute phase of infection. The lowest concentration of SARS-CoV-2 viral copies this assay can detect is 138 copies/mL. A negative result does not preclude SARS-Cov-2 infection and should not be used as the sole basis for treatment or other patient management decisions. A negative result may occur with  improper specimen collection/handling, submission of specimen other than nasopharyngeal swab, presence of viral mutation(s) within the areas targeted by this assay, and inadequate number of viral copies(<138 copies/mL). A negative result must be combined with clinical observations, patient history, and  epidemiological information. The expected result is Negative.  Fact Sheet for Patients:  EntrepreneurPulse.com.au  Fact Sheet for Healthcare Providers:  IncredibleEmployment.be  This test is no t yet approved or cleared by the [place] FDA and  has been authorized for detection and/or diagnosis of SARS-CoV-2 by FDA under an Emergency Use Authorization (EUA). This EUA will remain  in effect (meaning this test can be used) for the duration of the COVID-19 declaration under Section 564(b)(1) of the Act, 21 U.S.C.section 360bbb-3(b)(1), unless the authorization is terminated  or revoked sooner.       Influenza A by PCR NEGATIVE NEGATIVE Final   Influenza B by PCR NEGATIVE NEGATIVE Final    Comment: (NOTE) The Xpert Xpress SARS-CoV-2/FLU/RSV plus assay is intended as an aid in the diagnosis of influenza from Nasopharyngeal swab specimens and should not be used as a sole basis for treatment. Nasal washings and aspirates are unacceptable for Xpert Xpress SARS-CoV-2/FLU/RSV testing.  Fact Sheet for Patients: EntrepreneurPulse.com.au  Fact Sheet for Healthcare Providers: IncredibleEmployment.be  This test is not yet approved or cleared by the [place] FDA and has been authorized for detection and/or diagnosis of SARS-CoV-2 by FDA under an Emergency Use Authorization (EUA). This EUA will remain in effect (meaning this test can be used) for the duration of the COVID-19 declaration under Section 564(b)(1) of the Act, 21 U.S.C. section 360bbb-3(b)(1), unless the authorization is terminated or revoked.  Performed at [facility], [place] [address], [place], [place] [address]     RN Pressure Injury Documentation:     Estimated body mass index is 21.01 kg/m as calculated from the following:   Height as of this encounter: 5\' 9"  (1.753 m).   Weight as of this encounter: 64.5  kg.  Malnutrition Type: Nutrition Problem: Severe Malnutrition Etiology: acute illness (likely new dx of cancer) Malnutrition Characteristics: Signs/Symptoms: severe fat depletion,severe muscle depletion Nutrition Interventions: Interventions: Ensure Enlive (each supplement provides 350kcal and 20 grams of protein)  Radiology Studies: MR BRAIN W WO CONTRAST  Result Date: 05/18/2021 CLINICAL DATA:  Left chest malignancy.  Staging. EXAM: MRI HEAD WITHOUT AND WITH CONTRAST TECHNIQUE: Multiplanar, multiecho pulse sequences of the brain and surrounding structures were obtained without and with intravenous contrast. CONTRAST:  78mL GADAVIST GADOBUTROL 1 MMOL/ML IV SOLN COMPARISON:  None. FINDINGS: Brain: The brain itself has a normal appearance  without evidence atrophy, old or acute small or large vessel infarction, primary or metastatic mass lesion, hemorrhage, hydrocephalus or extra-axial collection. No abnormal contrast enhancement occurs. Vascular: Major vessels at the base of the brain show flow. Skull and upper cervical spine: Question abnormal signal in the tip of the clivus in the dens. Osseous metastatic disease not excluded. Sinuses/Orbits: Opacified maxillary sinuses, more extensively on the right than the left. Orbits negative. Other: Frontal scalp lipoma. IMPRESSION: Motion degraded study, but without evidence of intracranial metastatic disease. Question abnormal marrow signal at the tip of the clivus and affecting the dens. Metastatic disease to the bones of these levels not excluded. Maxillary sinus opacification/inflammatory change, worse on the right than the left. Electronically Signed   By: Nelson Chimes M.D.   On: 05/18/2021 17:19   CT BIOPSY  Result Date: 05/20/2021 INDICATION: 65 year old male with significant left hemithorax malignant process and multifocal lytic osseous lesions concerning for primary bronchogenic carcinoma with bone metastases. He presents for CT-guided biopsy of 1 of  the lytic bone lesions. EXAM: CT-guided core biopsy right iliac bone lesion MEDICATIONS: None. ANESTHESIA/SEDATION: Moderate (conscious) sedation was employed during this procedure. A total of Versed 1 mg and Fentanyl 50 mcg was administered intravenously. Moderate Sedation Time: 10 minutes. The patient's level of consciousness and vital signs were monitored continuously by radiology nursing throughout the procedure under my direct supervision. FLUOROSCOPY TIME:  None. COMPLICATIONS: None immediate. PROCEDURE: Informed written consent was obtained from the patient after a thorough discussion of the procedural risks, benefits and alternatives. All questions were addressed. Maximal Sterile Barrier Technique was utilized including caps, mask, sterile gowns, sterile gloves, sterile drape, hand hygiene and skin antiseptic. A timeout was performed prior to the initiation of the procedure. A planning CT scan was performed. The lytic lesion in the right posterior iliac bone was identified. The overlying skin was prepped and marked. Local anesthesia was attained by infiltration with 1% lidocaine. A small dermatotomy was made. Under intermittent CT guidance, a 13 gauge trocar needle was advanced through the cortex and positioned at the margin of the lytic component of the mass. Multiple 11 gauge core biopsies were then obtained coaxially using the OnControl biopsy device. Biopsy specimens were placed in formalin and delivered to pathology for further analysis. IMPRESSION: Technically successful CT-guided core biopsy of right lytic iliac bone lesion. Electronically Signed   By: Jacqulynn Cadet M.D.   On: 05/20/2021 12:32   DG CHEST PORT 1 VIEW  Result Date: 05/20/2021 CLINICAL DATA:  Shortness of breath. EXAM: PORTABLE CHEST 1 VIEW COMPARISON:  May 17, 2021.  May 18, 2021. FINDINGS: There remains complete consolidation of the left hemithorax consistent with probable mass and effusion as noted on prior CT exam. Right lung  is unremarkable. No definite pneumothorax is noted. Lytic lesions are seen involving the lateral portions of the left sixth and seventh ribs consistent with metastatic disease. IMPRESSION: Stable complete consolidation of the left hemithorax as described above. Lytic lesions are noted in the left sixth and seventh ribs consistent with metastatic disease. Electronically Signed   By: Marijo Conception M.D.   On: 05/20/2021 08:45   Scheduled Meds: . [MAR Hold] feeding supplement  237 mL Oral BID BM  . midazolam       Continuous Infusions: . [MAR Hold] methocarbamol (ROBAXIN) IV    . potassium PHOSPHATE IVPB (in mmol) 20 mmol (05/20/21 1002)    LOS: 2 days    Kerney Elbe, DO [facility] PAGER  is on AMION  If 7PM-7AM, please contact night-coverage [email or web address]

## 2021-05-20 NOTE — Progress Notes (Signed)
Initial Nutrition Assessment  DOCUMENTATION CODES:   Severe malnutrition in context of acute illness/injury  INTERVENTION:  - continue Ensure Enlive BID, each supplement provides 350 kcal and 20 grams of protein.   NUTRITION DIAGNOSIS:   Severe Malnutrition related to acute illness (likely new dx of cancer) as evidenced by severe fat depletion,severe muscle depletion  GOAL:   Patient will meet greater than or equal to 90% of their needs  MONITOR:   PO intake,Supplement acceptance,Labs,Weight trends  REASON FOR ASSESSMENT:   Malnutrition Screening Tool  ASSESSMENT:   65 year old male with is a smoker and has not seen a doctor in many years. He presented to the ED due to worsening back pain, unintentional weight loss, and decreased appetite. In the ED his serum Ca was 14.3 mg/dl and CT of chest and abdomen/pelvis was concerning for L hemithorax malignancy, pleural thickening and nodularity, loculated effusion, and invasion into the mediastinum, skeletal lytic lesions, and retroperitoneal lesion. Pending MRI brain.  No intakes documented since admission. Patient laying in bed with two sisters at bedside. Most of the information is provided by patient although his sisters do add some information as well.   Patient lives with his son. During the pandemic he was "eating like a pig" but in the past few months has had a decreased appetite and mainly consumes items such as milk and cookies or salads. He does cook at times. His son cooks his own meals, does not cook for the patient.   Patient works at [employer] and will sometimes eat while he is there but reports that foods from the vending machine do not taste good. He denies any taste changes recently and reports that these are foods he has never liked the taste of.  Patient noted to have very few teeth. He sometimes has difficulty chewing hamburgers but denies chewing difficulties with anything else. He denies any swallowing  difficulties.  He weighs himself daily and reports UBW of 180 lb, which he last weighed either a month or a few months ago, he is unable to recall. He reports that on the day PTA he weighed 146 lb.   Weight today is 142 lb and weight on 6/3 was 143 lb. No other weight recordings available in the chart.    Labs reviewed; creatinine: 0.56 mg/dl, Phos: 1.7 mg/dl. Medications reviewed; 20 mmol IV KPhos x1 dose 6/6, 20 mmol IV NaPhos x1 run 6/5.     NUTRITION - FOCUSED PHYSICAL EXAM:  Flowsheet Row Most Recent Value  Orbital Region Moderate depletion  Upper Arm Region Severe depletion  Thoracic and Lumbar Region Unable to assess  Buccal Region Severe depletion  Temple Region Mild depletion  Clavicle Bone Region Severe depletion  Clavicle and Acromion Bone Region Severe depletion  Scapular Bone Region Unable to assess  Dorsal Hand Mild depletion  Patellar Region Moderate depletion  Anterior Thigh Region Moderate depletion  Posterior Calf Region Severe depletion  Edema (RD Assessment) None  Hair Reviewed  Eyes Reviewed  Mouth Reviewed  [missing most of his teeth]  Skin Reviewed  Nails Reviewed       Diet Order:   Diet Order            Diet regular Room service appropriate? Yes; Fluid consistency: Thin  Diet effective now                 EDUCATION NEEDS:   No education needs have been identified at this time  Skin:  Skin Assessment: Reviewed RN  Assessment  Last BM:  PTA/unknown  Height:   Ht Readings from Last 1 Encounters:  05/18/21 5\' 9"  (1.753 m)    Weight:   Wt Readings from Last 1 Encounters:  05/20/21 64.5 kg     Estimated Nutritional Needs:  Kcal:  2100-2350 kcal Protein:  105-120 grams Fluid:  >/= 2.2 L/day     Jarome Matin, MS, RD, LDN, CNSC Inpatient Clinical Dietitian RD pager # available in AMION  After hours/weekend pager # available in [facility]

## 2021-05-20 NOTE — Progress Notes (Signed)
NAME:  Bobby Pope, MRN:  [id number], DOB:  06/12/1956, LOS: 2 ADMISSION DATE:  05/17/2021, CONSULTATION DATE:  05/18/21 REFERRING MD:  [facility], CHIEF COMPLAINT:  Back pain/ lung mass  History of Present Illness:  65 year old thin chronically ill-appearing AAM who is a current smoker and has really seen physician and denies any other medical history who presented to the emergency room for evaluation of worsening back pain.  ? Wt loss over 30 pounds. .  He has an occasional nonproductive cough but did not feel shortness of breath.  Upon ED evaluation he was found to be afebrile and slightly tachypneic and EKG did show some ST elevations inferiorly but his high-sensitivity troponins were normal.  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  He was given IV fluids, Lasix, calcitonin and Decadron as well as Zometa in the ED.  CT surgery evaluated his scans and felt that he is not a surgical candidate and recommended pulmonary versus interventional radiology evaluation for image guided biopsy.  Recommendation from Dr. Kipp Brood was to obtain a PET CT scan and MRI of the brain.    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 Underlying pulmonary mass difficult to measure/visualize. Associated loculated left pleural effusion. Invasion  into the mediastinum with associated narrowing of the left main pulmonary artery as well as left mainstem bronchus. 2. Mediastinal and left hilar lymphadenopathy. 3. Several right lower lobe pulmonary nodules measuring up to 0.9 x 0.5 cm.   4. Axial and appendicular skeleton lytic lesions with cortical destruction consistent with metastases. 5. Question pathologic nondisplaced fracture of the right posterior tenth rib. 6. A left retroperitoneal 3.3 x 2.1 cm lobulated lesion may be arising from the left adrenal gland versus represent a lymph node. Finding likely metastasis. .        Interim History / Subjective:  Patient awake on room air, no respiratory distress  Objective   Blood pressure 137/89, pulse 84, temperature 98.1 F (36.7 C), temperature source Oral, resp. rate 16, height 5' 9" (1.753 m), weight 64.5 kg, SpO2 97 %.        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 Loanne Drilling afternoon 6/7 -rest of plan per primary team       Labs   CBC: Recent Labs  Lab 05/17/21 2327  05/18/21 0850 05/19/21 0243 05/20/21 0335  WBC 9.7 9.3 7.1 7.2  NEUTROABS 8.1* 7.8* 5.7 5.8  HGB 14.0 13.7 13.2 12.0*  HCT 44.4 42.5 42.0 38.4*  MCV 92.3 91.6 93.3 93.9  PLT 366 335 332 846    Basic Metabolic Panel: Recent Labs  Lab 05/17/21 2327 05/18/21 0850 05/19/21 0243 05/20/21 0335  NA 137 138 138 139  K 4.2 4.6 4.2 3.8  CL 98 97* 100 103  CO2 _0 GLUCOSE 114* 109* 90 84  BUN 42* 33* 30* 21  CREATININE 0.92 0.83 0.82 0.56*  CALCIUM 14.3* 13.3* 11.5* 9.9  MG 1.5* 1.5* 1.8 1.9  PHOS  --  3.1 1.5* 1.7*   GFR: Estimated Creatinine Clearance: 85.1 mL/min (A) (by C-G formula based on SCr of 0.56 mg/dL (L)). Recent Labs  Lab 05/17/21 2327 05/18/21 0850 05/19/21 0243 05/20/21 0335  WBC 9.7 9.3 7.1 7.2    Liver Function Tests: Recent Labs  Lab 05/17/21 2327 05/18/21 0850 05/19/21 0243 05/20/21 0335  AST _1 12*  ALT _2 ALKPHOS 88 81 77 65  BILITOT 0.8 0.9 0.6 0.8  PROT 7.3 7.1 6.8 6.1*  ALBUMIN 3.7 3.4* 3.1* 2.9*   No results for input(s): LIPASE, AMYLASE in the last 168 hours. No results for input(s): AMMONIA in the last 168 hours.  ABG No results found for: PHART, PCO2ART, PO2ART, HCO3, TCO2, ACIDBASEDEF, O2SAT   Coagulation Profile: No results for input(s): INR, PROTIME in the last 168 hours.  Cardiac Enzymes: No results for input(s): CKTOTAL, CKMB, CKMBINDEX, TROPONINI in the last 168 hours.  HbA1C: No results found for: HGBA1C  CBG: No results for input(s): GLUCAP in the last 168 hours.     Past Medical History:  He,  has no past medical history on file.   Surgical History:  History reviewed. No pertinent surgical history.   Social History:   reports that he has been smoking cigarettes. He has been smoking about 0.25 packs per day. He has never used smokeless tobacco. He reports current alcohol use.   Family History:  His Family history is unknown by patient.   Allergies No Known Allergies   Home  Medications  Prior to Admission medications   Medication Sig Start Date End Date Taking? Authorizing Provider  tiZANidine (ZANAFLEX) 4 MG capsule Take 1 capsule (4 mg total) by mouth 2 (two) times daily as needed for muscle spasms. 05/08/21  Yes Raspet, Derry Skill, PA-C      Otilio Carpen Tyashia Morrisette, PA-C [facility] See Amion for pager If no response to pager , please call [phone] until 7pm After 7:00 pm call Elink  [phone]?[place]

## 2021-05-21 ENCOUNTER — Inpatient Hospital Stay (HOSPITAL_COMMUNITY): Payer: Medicaid Other | Admitting: Certified Registered"

## 2021-05-21 ENCOUNTER — Encounter (HOSPITAL_COMMUNITY): Payer: Self-pay | Admitting: Family Medicine

## 2021-05-21 ENCOUNTER — Inpatient Hospital Stay (HOSPITAL_COMMUNITY): Payer: Medicaid Other

## 2021-05-21 ENCOUNTER — Encounter (HOSPITAL_COMMUNITY)
Admission: EM | Disposition: A | Discharge status: Home / Self Care | Payer: Self-pay | Source: Home / Self Care | Attending: Internal Medicine

## 2021-05-21 LAB — PROTIME-INR
INR: 1 (ref 0.8–1.2)
Prothrombin Time: 13.1 seconds (ref 11.4–15.2)

## 2021-05-21 LAB — CBC WITH DIFFERENTIAL/PLATELET
Abs Immature Granulocytes: 0.01 10*3/uL (ref 0.00–0.07)
Basophils Absolute: 0 10*3/uL (ref 0.0–0.1)
Basophils Relative: 0 %
Eosinophils Absolute: 0 10*3/uL (ref 0.0–0.5)
Eosinophils Relative: 1 %
HCT: 36.9 % — ABNORMAL LOW (ref 39.0–52.0)
Hemoglobin: 11.5 g/dL — ABNORMAL LOW (ref 13.0–17.0)
Immature Granulocytes: 0 %
Lymphocytes Relative: 14 %
Lymphs Abs: 0.7 10*3/uL (ref 0.7–4.0)
MCH: 29.3 pg (ref 26.0–34.0)
MCHC: 31.2 g/dL (ref 30.0–36.0)
MCV: 94.1 fL (ref 80.0–100.0)
Monocytes Absolute: 0.6 10*3/uL (ref 0.1–1.0)
Monocytes Relative: 11 %
Neutro Abs: 4.1 10*3/uL (ref 1.7–7.7)
Neutrophils Relative %: 74 %
Platelets: 271 10*3/uL (ref 150–400)
RBC: 3.92 MIL/uL — ABNORMAL LOW (ref 4.22–5.81)
RDW: 15.3 % (ref 11.5–15.5)
WBC: 5.4 10*3/uL (ref 4.0–10.5)
nRBC: 0 % (ref 0.0–0.2)

## 2021-05-21 LAB — COMPREHENSIVE METABOLIC PANEL
ALT: 9 U/L (ref 0–44)
AST: 13 U/L — ABNORMAL LOW (ref 15–41)
Albumin: 2.6 g/dL — ABNORMAL LOW (ref 3.5–5.0)
Alkaline Phosphatase: 63 U/L (ref 38–126)
Anion gap: 9 (ref 5–15)
BUN: 17 mg/dL (ref 8–23)
CO2: 26 mmol/L (ref 22–32)
Calcium: 9 mg/dL (ref 8.9–10.3)
Chloride: 101 mmol/L (ref 98–111)
Creatinine, Ser: 0.64 mg/dL (ref 0.61–1.24)
GFR, Estimated: >60 mL/min (ref >60)
Glucose, Bld: 84 mg/dL (ref 70–99)
Potassium: 3.7 mmol/L (ref 3.5–5.1)
Sodium: 136 mmol/L (ref 135–145)
Total Bilirubin: 0.4 mg/dL (ref 0.3–1.2)
Total Protein: 5.8 g/dL — ABNORMAL LOW (ref 6.5–8.1)

## 2021-05-21 LAB — PHOSPHORUS: Phosphorus: 1.9 mg/dL — ABNORMAL LOW (ref 2.5–4.6)

## 2021-05-21 LAB — APTT: aPTT: 36 seconds (ref 24–36)

## 2021-05-21 LAB — MAGNESIUM: Magnesium: 1.7 mg/dL (ref 1.7–2.4)

## 2021-05-21 SURGERY — INVASIVE LAB ABORTED CASE
Anesthesia: Monitor Anesthesia Care | Laterality: Bilateral

## 2021-05-21 MED ORDER — POTASSIUM PHOSPHATES 15 MMOLE/5ML IV SOLN
20.0000 mmol | Freq: Once | INTRAVENOUS | Status: AC
  Administered 2021-05-21: 20 mmol via INTRAVENOUS
  Filled 2021-05-21: qty 6.67

## 2021-05-21 MED ORDER — MAGNESIUM SULFATE 2 GM/50ML IV SOLN: 2.0000 g | Freq: Once | INTRAVENOUS | Status: DC

## 2021-05-21 MED ORDER — MIDAZOLAM HCL 2 MG/2ML IJ SOLN
INTRAMUSCULAR | Status: AC
  Filled 2021-05-21: qty 2

## 2021-05-21 MED ORDER — KETAMINE HCL 10 MG/ML IJ SOLN
INTRAMUSCULAR | Status: AC
  Filled 2021-05-21: qty 1

## 2021-05-21 MED ORDER — KETAMINE HCL 10 MG/ML IJ SOLN
INTRAMUSCULAR | Status: DC | PRN
  Administered 2021-05-21: 30 mg via INTRAVENOUS

## 2021-05-21 MED ORDER — LIDOCAINE HCL 1 % IJ SOLN
INTRAMUSCULAR | Status: AC
  Filled 2021-05-21: qty 20

## 2021-05-21 MED ORDER — BUTAMBEN-TETRACAINE-BENZOCAINE 2-2-14 % EX AERO
INHALATION_SPRAY | CUTANEOUS | Status: DC | PRN
  Administered 2021-05-21: 2 via TOPICAL

## 2021-05-21 MED ORDER — LACTATED RINGERS IV SOLN: INTRAVENOUS | Status: DC

## 2021-05-21 MED ORDER — LIDOCAINE HCL 1 % IJ SOLN
INTRAMUSCULAR | Status: AC
  Filled 2021-05-21: qty 1

## 2021-05-21 MED ORDER — MIDAZOLAM HCL 5 MG/5ML IJ SOLN
INTRAMUSCULAR | Status: DC | PRN
  Administered 2021-05-21: 1 mg via INTRAVENOUS

## 2021-05-21 MED ORDER — LIDOCAINE 2% (20 MG/ML) 5 ML SYRINGE
INTRAMUSCULAR | Status: DC | PRN
  Administered 2021-05-21: 60 mg via INTRAVENOUS

## 2021-05-21 MED ORDER — GLYCOPYRROLATE 0.2 MG/ML IJ SOLN
INTRAMUSCULAR | Status: DC | PRN
  Administered 2021-05-21: .2 mg via INTRAVENOUS

## 2021-05-21 MED ORDER — PROPOFOL 500 MG/50ML IV EMUL
INTRAVENOUS | Status: DC | PRN
  Administered 2021-05-21: 75 ug/kg/min via INTRAVENOUS

## 2021-05-21 NOTE — Transfer of Care (Signed)
Immediate Anesthesia Transfer of Care Note  Patient: Bobby Pope  Procedure(s) Performed: ENDOBRONCHIAL ULTRASOUND (Bilateral )  Patient Location: PACU  Anesthesia Type:MAC  Level of Consciousness: sedated  Airway & Oxygen Therapy: Patient Spontanous Breathing and Patient connected to face mask oxygen  Post-op Assessment: Report given to RN and Post -op Vital signs reviewed and stable  Post vital signs: Reviewed and stable  Last Vitals:  Vitals Value Taken Time  BP    Temp    Pulse    Resp    SpO2      Last Pain:  Vitals:   05/21/21 1311  TempSrc: Oral  PainSc:       Patients Stated Pain Goal: 3 (56/31/49 7026)  Complications: No complications documented.

## 2021-05-21 NOTE — Procedures (Addendum)
Bronchoscopy Procedure Note  Ariyon Mittleman  [id number]  1956-09-29  Date:05/21/21  Time:6:12 PM   Provider Performing:Mazzie Brodrick Rodman Pickle   Procedure(s):  Flexible Bronchoscopy [phone])  Indication(s) Left lung mass  Consent Risks of the procedure as well as the alternatives and risks of each were explained to the patient and/or caregiver.  Consent for the procedure was obtained and is signed in the bedside chart  Anesthesia Moderate sedation per anesthesia   Time Out Verified patient identification, verified procedure, site/side was marked, verified correct patient position, special equipment/implants available, medications/allergies/relevant history reviewed, required imaging and test results available.   Sterile Technique Usual hand hygiene, masks, gowns, and gloves were used   Procedure Description Technically difficult procedure.  Bronchoscope advanced through mouth and into airway. Visualized larynx with enlarged epiglottis and boggy entryway with narrow opening between vocal cords. Unable to pass endobronchial ultrasound between vocal cords despite multiple attempts due to patient anatomy, cough and size of bronchoscope. Decision made abort procedure for safety.  Findings:  Boggy larynx  Complications/Tolerance None; patient tolerated the procedure well. Chest X-ray is needed post procedure.   EBL Minimal   Specimen(s) None

## 2021-05-21 NOTE — Progress Notes (Signed)
PROGRESS NOTE    Bobby Pope  IHK:[id number] DOB: [birth year] DOA: 05/17/2021 PCP: Pcp, No   Brief Narrative:  The patient is a 65 year old thin chronically ill-appearing AAM who is a current smoker and has really seen physician and denies any other medical history who presented to the emergency room for evaluation of worsening back pain.  His sister also raise concern for unintentional weight loss for the last few months and patient does not know how much she lost with thinks is over 30 pounds.  He reported waxing and waning back pain without injury or inciting event involving bilateral lower back with no extremity numbness or weakness.  He denies any incontinence or saddle anesthesia but did report a loss of appetite and weight loss over several months.  He has an occasional nonproductive cough but did not feel shortness of breath.  Upon ED evaluation he was found to be afebrile and slightly tachypneic and EKG did show some ST elevations inferiorly but his high-sensitivity troponins were normal.  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  He was given IV fluids, Lasix, calcitonin and Decadron as well as Zometa in the ED.  CT surgery evaluated his scans and felt that he is not a surgical candidate and recommended pulmonary versus interventional radiology evaluation for image guided biopsy.  Recommendation from Dr. Kipp Brood was to obtain a PET CT scan and MRI of the brain.  We will obtain an MRI of the brain here and we have consulted pulmonary and interventional radiology for further evaluation and assistance  MRI Brain done and showed "Motion degraded study, but without evidence of intracranial  metastatic disease. Question abnormal marrow signal at the tip of the clivus and affecting the dens. Metastatic disease to the bones of these levels not excluded. Maxillary sinus opacification/inflammatory change, worse on the right than the left." Patient unable to get LN Bx so instead got a  CT Guided Core Bx of the Right Bone Lesion. Pulmonary consulted and will get a Thoracentesis and possible FOB for Bx that way since Lymph node Bx was not done.  When patient went down for his thoracentesis yesterday if there is not no fluid so he will be getting a fiberoptic bronchoscopy and biopsy this way today.  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 -Smoker who denies PMHx but has not seen a physician in many years presents with worsening back pain and is found to have left lung mass with loculated effusion, invasion into mediastinum, lytic bone lesions, RLL nodules, and retroperitoneal lesion -Discussed with CT surgery who will review scans and he is currently not a surgical candidate -Discussed with oncology, will need biopsy and IR is planning on a LN biopsy with U/S and if unable to visualized they are recommending Bronch for Bx -MRI brain done for staging if small cell lung cancer; MRI showed "Motion degraded study, but without evidence of intracranial metastatic disease. Question abnormal marrow signal at the tip of the clivus and affecting the dens. Metastatic disease to the bones of these levels not excluded. Maxillary sinus  opacification/inflammatory change, worse on the right than the left." -Consulted pulmonary and interventional radiology for biopsy; See Above but Pulmonary recommending accessing his loculated effusion for cytology and recommending a thoracentesis under ultrasound as soon as possible but  have still added the patient to the list for a FOB 05/20/21 in the afternoon if Unable to get a LN Bx -LN Biopsy not able to be done so CT Guided Bx done of the Bone Lesion in the Right iliac  -Pulmonary planning on doing Thoracentesis and possible FOB for Bx; thoracentesis could not be adequately done due to lack of appropriate fluid he will be getting a biopsy today via bronc -Continue follow Bx Results and Fluid Analysis; Will need Tissue Biopsy per Oncology for Molecular Testing   Acute Respiratory Failure with Hypoxia -In the Setting of Above -IVF now Stopped -SpO2: 95 % O2 Flow Rate (L/min): 2 L/min -Now off of Supplemental O2 -Will get a Thoracentesis -Continue Supplemental O2 via [place] and [place] as Tolerated -Continuous Pulse Oximetry and Maintain O2 Saturations >92% -Repeat CX in the AM and Follow Pulmonary Recc's  Normocytic Anemia -Patient's Hgb/Hct has been trending down and went from 13.7/42.5 -> 13.2/42.0 -> 12.0/38.4 -> 11.5/36.9 and ? Related to Malignancy of Dilutional Drop -Check Anemia Panel in the AM  -Continue to Monitor S/Sx of Bleeding; No overt bleeding in the AM -Repeat CBC in the AM   Hypercalcemialikely of Malignancy  -Serum calcium 14.3 on admission and improved to 9.0 today  -Appears to be asymptomatic, likely related to his malignancy -Treated with Zometa, calcitonin, and IVF in ED -Stopped IVF Hydration -Continue monitor and trend and repeat CMP in a.m.  Hypomagnesemia -Patient's Mag Level is now 1.7 -Replete with IV Mag Sulfate 2 grams -Continue to Monitor and Trend -Repeat Mag Level in the AM  Hypophosphatemia -Phosphorus level was 1.5 -> 1.7 -> 1.9 -Replete with IV K Phos 20 mmol again -Continue to monitor and replete as necessary -Repeat phosphorus level in the a.m.  Hyperglycemia -Patient's blood sugar ranging from 90-114 -Likely reactive and also in the setting of steroid demargination -Check hemoglobin A1c  -Continue to monitor blood  sugars per protocol and if necessary will add a sensitive NovoLog sliding scale insulin AC  Severe Malnutrition in the Context of Acute Illness/Injury -Nutritionist Consulted for further evaluation and recommendations  -C/w Ensure Enlive po BID   DVT prophylaxis: SCDs Code Status: FULL CODE  Family Communication: Discussed with Family at bedside  Disposition Plan: Pending further clinical Workup and evaluation by Specialists; Getting a Bronch with Bx today  Status is: Inpatient  Remains inpatient appropriate because:Unsafe d/c plan, IV treatments appropriate due to intensity of illness or inability to take PO and Inpatient level of care appropriate due to severity of illness   Dispo: The patient is from: Home              Anticipated d/c is to: TBD              Patient currently is not medically stable to d/c.   Difficult to place patient No  Consultants:   Cardiothroacic Surgery  Pulmonary  Interventional Radiology  Medical Oncology    Procedures:   Antimicrobials: Anti-infectives (From admission, onward)   None        Subjective: Seen and examined at bedside he feels okay with no complaints of any pain.  No nausea or vomiting.  Understands that he will be getting a bronchoscopy biopsy this afternoon.  No chest pain  or shortness of breath.  No other concerns or complaints this time.  Objective: Vitals:   05/20/21 1603 05/20/21 2244 05/21/21 0353 05/21/21 1251  BP: (!) 140/99 128/78 118/71 (!) 136/93  Pulse: 82 79  80  Resp: 20 20  17   Temp: 98.4 F (36.9 C) 98.6 F (37 C) 98.3 F (36.8 C) 98.5 F (36.9 C)  TempSrc: Oral Oral Oral Oral  SpO2: 97% 92% 91% 95%  Weight:      Height:        Intake/Output Summary (Last 24 hours) at 05/21/2021 1257 Last data filed at 05/21/2021 1123 Gross per 24 hour  Intake 874.76 ml  Output 475 ml  Net 399.76 ml   Filed Weights   05/18/21 2050 05/19/21 0500 05/20/21 0500  Weight: 64.9 kg 61.8 kg 64.5 kg    Examination: Physical Exam:  Constitutional: Thin cachectic chronically ill-appearing African-American male currently no acute distress  Eyes: Lids and conjunctivae normal, sclerae anicteric  ENMT: External Ears, Nose appear normal. Grossly normal hearing. Poor [place]ition Neck: Appears normal, supple, no cervical masses, normal ROM, no appreciable thyromegaly; no JVD Respiratory: Diminished to auscultation bilaterally more so on the Right compared to the left with coarse breath sounds; no wheezing, rales, rhonchi or crackles. Normal respiratory effort and patient is not tachypenic. No accessory muscle use. Unlabored breathing Cardiovascular: RRR, no murmurs / rubs / gallops. S1 and S2 auscultated. No extremity edema. Abdomen: Soft, non-tender, non-distended. No masses palpated. No appreciable hepatosplenomegaly. Bowel sounds positive.  GU: Deferred. Musculoskeletal: No clubbing / cyanosis of digits/nails. No joint deformity upper and lower extremities.  Skin: No rashes, lesions, ulcers on a limited skin evaluation. No induration; Warm and dry.  Neurologic: CN 2-12 grossly intact with no focal deficits. Romberg sign and cerebellar reflexes not assessed.  Psychiatric: Normal judgment and insight. Alert and oriented x 3. Normal mood and appropriate affect.   Data Reviewed: I have personally reviewed following labs and imaging studies  CBC: Recent Labs  Lab 05/17/21 2327 05/18/21 0850 05/19/21 0243 05/20/21 0335 05/21/21 0349  WBC 9.7 9.3 7.1 7.2 5.4  NEUTROABS 8.1* 7.8* 5.7 5.8 4.1  HGB 14.0 13.7 13.2 12.0* 11.5*  HCT 44.4 42.5 42.0 38.4* 36.9*  MCV 92.3 91.6 93.3 93.9 94.1  PLT 366 335 332 274 254   Basic Metabolic Panel: Recent Labs  Lab 05/17/21 2327 05/18/21 0850 05/19/21 0243 05/20/21 0335 05/21/21 0349  NA 137 138 138 139 136  K 4.2 4.6 4.2 3.8 3.7  CL 98 97* 100 103 101  CO2 27 28 28 27 26   GLUCOSE 114* 109* 90 84 84  BUN 42* 33* 30* 21 17  CREATININE 0.92 0.83  0.82 0.56* 0.64  CALCIUM 14.3* 13.3* 11.5* 9.9 9.0  MG 1.5* 1.5* 1.8 1.9 1.7  PHOS  --  3.1 1.5* 1.7* 1.9*   GFR: Estimated Creatinine Clearance: 85.1 mL/min (by C-G formula based on SCr of 0.64 mg/dL). Liver Function Tests: Recent Labs  Lab 05/17/21 2327 05/18/21 0850 05/19/21 0243 05/20/21 0335 05/21/21 0349  AST 18 19 16  12* 13*  ALT 12 11 10 10 9   ALKPHOS 88 81 77 65 63  BILITOT 0.8 0.9 0.6 0.8 0.4  PROT 7.3 7.1 6.8 6.1* 5.8*  ALBUMIN 3.7 3.4* 3.1* 2.9* 2.6*   No results for input(s): LIPASE, AMYLASE in the last 168 hours. No results for input(s): AMMONIA in the last 168 hours. Coagulation Profile: Recent Labs  Lab 05/21/21 0349  INR 1.0   Cardiac  Enzymes: No results for input(s): CKTOTAL, CKMB, CKMBINDEX, TROPONINI in the last 168 hours. BNP (last 3 results) No results for input(s): PROBNP in the last 8760 hours. HbA1C: No results for input(s): HGBA1C in the last 72 hours. CBG: No results for input(s): GLUCAP in the last 168 hours. Lipid Profile: No results for input(s): CHOL, HDL, LDLCALC, TRIG, CHOLHDL, LDLDIRECT in the last 72 hours. Thyroid Function Tests: No results for input(s): TSH, T4TOTAL, FREET4, T3FREE, THYROIDAB in the last 72 hours. Anemia Panel: No results for input(s): VITAMINB12, FOLATE, FERRITIN, TIBC, IRON, RETICCTPCT in the last 72 hours. Sepsis Labs: No results for input(s): PROCALCITON, LATICACIDVEN in the last 168 hours.  Recent Results (from the past 240 hour(s))  Resp Panel by RT-PCR (Flu A&B, Covid) Nasopharyngeal Swab     Status: None   Collection Time: 05/18/21 12:18 AM   Specimen: Nasopharyngeal Swab; Nasopharyngeal(NP) swabs in vial transport medium  Result Value Ref Range Status   SARS Coronavirus 2 by RT PCR NEGATIVE NEGATIVE Final    Comment: (NOTE) SARS-CoV-2 target nucleic acids are NOT DETECTED.  The SARS-CoV-2 RNA is generally detectable in upper respiratory specimens during the acute phase of infection. The  lowest concentration of SARS-CoV-2 viral copies this assay can detect is 138 copies/mL. A negative result does not preclude SARS-Cov-2 infection and should not be used as the sole basis for treatment or other patient management decisions. A negative result may occur with  improper specimen collection/handling, submission of specimen other than nasopharyngeal swab, presence of viral mutation(s) within the areas targeted by this assay, and inadequate number of viral copies(<138 copies/mL). A negative result must be combined with clinical observations, patient history, and epidemiological information. The expected result is Negative.  Fact Sheet for Patients:  EntrepreneurPulse.com.au  Fact Sheet for Healthcare Providers:  IncredibleEmployment.be  This test is no t yet approved or cleared by the [place] FDA and  has been authorized for detection and/or diagnosis of SARS-CoV-2 by FDA under an Emergency Use Authorization (EUA). This EUA will remain  in effect (meaning this test can be used) for the duration of the COVID-19 declaration under Section 564(b)(1) of the Act, 21 U.S.C.section 360bbb-3(b)(1), unless the authorization is terminated  or revoked sooner.       Influenza A by PCR NEGATIVE NEGATIVE Final   Influenza B by PCR NEGATIVE NEGATIVE Final    Comment: (NOTE) The Xpert Xpress SARS-CoV-2/FLU/RSV plus assay is intended as an aid in the diagnosis of influenza from Nasopharyngeal swab specimens and should not be used as a sole basis for treatment. Nasal washings and aspirates are unacceptable for Xpert Xpress SARS-CoV-2/FLU/RSV testing.  Fact Sheet for Patients: EntrepreneurPulse.com.au  Fact Sheet for Healthcare Providers: IncredibleEmployment.be  This test is not yet approved or cleared by the [place] FDA and has been authorized for detection and/or diagnosis of SARS-CoV-2 by FDA under  an Emergency Use Authorization (EUA). This EUA will remain in effect (meaning this test can be used) for the duration of the COVID-19 declaration under Section 564(b)(1) of the Act, 21 U.S.C. section 360bbb-3(b)(1), unless the authorization is terminated or revoked.  Performed at [facility], [place] [address], [place], [place] [address]     RN Pressure Injury Documentation:     Estimated body mass index is 21.01 kg/m as calculated from the following:   Height as of this encounter: 5\' 9"  (1.753 m).   Weight as of this encounter: 64.5 kg.  Malnutrition Type: Nutrition Problem: Severe Malnutrition Etiology: acute  illness (likely new dx of cancer) Malnutrition Characteristics: Signs/Symptoms: severe fat depletion,severe muscle depletion Nutrition Interventions: Interventions: Ensure Enlive (each supplement provides 350kcal and 20 grams of protein)  Radiology Studies: CT BIOPSY  Result Date: 06/13/21 INDICATION: 64 year old male with significant left hemithorax malignant process and multifocal lytic osseous lesions concerning for primary bronchogenic carcinoma with bone metastases. He presents for CT-guided biopsy of 1 of the lytic bone lesions. EXAM: CT-guided core biopsy right iliac bone lesion MEDICATIONS: None. ANESTHESIA/SEDATION: Moderate (conscious) sedation was employed during this procedure. A total of Versed 1 mg and Fentanyl 50 mcg was administered intravenously. Moderate Sedation Time: 10 minutes. The patient's level of consciousness and vital signs were monitored continuously by radiology nursing throughout the procedure under my direct supervision. FLUOROSCOPY TIME:  None. COMPLICATIONS: None immediate. PROCEDURE: Informed written consent was obtained from the patient after a thorough discussion of the procedural risks, benefits and alternatives. All questions were addressed. Maximal Sterile Barrier Technique was utilized including caps, mask, sterile gowns,  sterile gloves, sterile drape, hand hygiene and skin antiseptic. A timeout was performed prior to the initiation of the procedure. A planning CT scan was performed. The lytic lesion in the right posterior iliac bone was identified. The overlying skin was prepped and marked. Local anesthesia was attained by infiltration with 1% lidocaine. A small dermatotomy was made. Under intermittent CT guidance, a 13 gauge trocar needle was advanced through the cortex and positioned at the margin of the lytic component of the mass. Multiple 11 gauge core biopsies were then obtained coaxially using the OnControl biopsy device. Biopsy specimens were placed in formalin and delivered to pathology for further analysis. IMPRESSION: Technically successful CT-guided core biopsy of right lytic iliac bone lesion. Electronically Signed   By: Jacqulynn Cadet M.D.   On: 06/13/2021 12:32   DG CHEST PORT 1 VIEW  Result Date: 05/21/2021 CLINICAL DATA:  Fall.  Back pain.  Shortness of breath. EXAM: PORTABLE CHEST 1 VIEW COMPARISON:  06/13/2021.  CT 05/18/2021. FINDINGS: Complete opacification of the left hemithorax with volume loss again noted. No interim change. Right lung pulmonary nodules best identified by prior CT. No pneumothorax. Heart size cannot be assessed. Lytic lesions of the left sixth and seventh ribs again noted. This is again consistent metastatic disease. IMPRESSION: 1. Complete opacification of the left hemithorax with volume loss again noted. No interim change. Right pulmonary nodules best identified by prior CT. Chest is unchanged from prior exam. 2. Lytic lesions of the left sixth and seventh ribs again noted. This is again consistent with metastatic disease. Electronically Signed   By: Marcello Moores  Register   On: 05/21/2021 06:01   DG CHEST PORT 1 VIEW  Result Date: 06/13/21 CLINICAL DATA:  Shortness of breath. EXAM: PORTABLE CHEST 1 VIEW COMPARISON:  May 17, 2021.  May 18, 2021. FINDINGS: There remains complete  consolidation of the left hemithorax consistent with probable mass and effusion as noted on prior CT exam. Right lung is unremarkable. No definite pneumothorax is noted. Lytic lesions are seen involving the lateral portions of the left sixth and seventh ribs consistent with metastatic disease. IMPRESSION: Stable complete consolidation of the left hemithorax as described above. Lytic lesions are noted in the left sixth and seventh ribs consistent with metastatic disease. Electronically Signed   By: Marijo Conception M.D.   On: 13-Jun-2021 08:45   Scheduled Meds: . feeding supplement  237 mL Oral BID BM   Continuous Infusions: . methocarbamol (ROBAXIN) IV    . potassium  PHOSPHATE IVPB (in mmol) 20 mmol (05/21/21 0949)    LOS: 3 days    Kerney Elbe, DO [facility] PAGER is on [place]  If 7PM-7AM, please contact night-coverage [email or web address]

## 2021-05-21 NOTE — Progress Notes (Addendum)
NAME:  Bobby Pope, MRN:  [id number], DOB:  Dec 29, 1955, LOS: 3 ADMISSION DATE:  05/17/2021, CONSULTATION DATE:  05/18/21 REFERRING MD:  Bobby Pope, CHIEF COMPLAINT:  Back pain/ lung mass  History of Present Illness:  64 year old thin chronically ill-appearing AAM who is a current smoker and has really seen physician and denies any other medical history who presented to the emergency room for evaluation of worsening back pain.  ? Wt loss over 30 pounds. .  He has an occasional nonproductive cough but did not feel shortness of breath.  Upon ED evaluation he was found to be afebrile and slightly tachypneic and EKG did show some ST elevations inferiorly but his high-sensitivity troponins were normal.  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  He was given IV fluids, Lasix, calcitonin and Decadron as well as Zometa in the ED.  CT surgery evaluated his scans and felt that he is not a surgical candidate and recommended pulmonary versus interventional radiology evaluation for image guided biopsy.  Recommendation from Dr. Kipp Pope was to obtain a PET CT scan and MRI of the brain.    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 Scheduled for the following day 6/6 Thoracentesis arranged however no amenable fluid pocket for drainage 6/7 Bronchoscopy with EBUS scheduled however unsuccessful      CT Chest 1. 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  Underlying pulmonary mass difficult to measure/visualize. Associated loculated left pleural effusion. Invasion into the mediastinum with associated narrowing of the left main pulmonary artery as well as left mainstem bronchus. 2. Mediastinal and left hilar lymphadenopathy. 3. Several right lower lobe pulmonary nodules measuring up to 0.9 x 0.5 cm.   4. Axial and appendicular skeleton lytic lesions with cortical destruction consistent with metastases. 5. Question pathologic nondisplaced fracture of the right posterior tenth rib. 6. A left retroperitoneal 3.3 x 2.1 cm lobulated lesion may be arising from the left adrenal gland versus represent a lymph node. Finding likely metastasis. .        Interim History / Subjective:  No complaints. Consented for procedure  Objective   Blood pressure 130/86, pulse 83, temperature 97.9 F (36.6 C), temperature source Oral, resp. rate (!) 24, height 5\' 9"  (1.753 m), weight 64.5 kg, SpO2 94 %.        Intake/Output Summary (Last 24 hours) at 05/21/2021 1428 Last data filed at 05/21/2021 1358 Gross per 24 hour  Intake 1274.76 ml  Output 475 ml  Net 799.76 ml   Filed Weights   05/18/21 2050 05/19/21 0500 05/20/21 0500  Weight: 64.9 kg 61.8 kg 64.5 kg    Physical Exam: General: Chronically ill-appearing, no acute distress HENT: [place], AT Eyes: EOMI, no scleral icterus Respiratory: Absent left breath sounds. Right sided rhonchi Cardiovascular: RRR, -M/R/G, no JVD GI: BS+, soft, nontender Extremities:-Edema,-tenderness Neuro: AAO x4, CNII-XII grossly intact Skin: Intact, no rashes or bruising Psych: Normal mood, normal affect   Assessment & Plan:   L Lung mass with metastatic disease to the bone,  retroperitoneal lesions and invasion into the mediastinum Hypercalcemia  Unsuccessful bronchoscopy. Procedure was performed under moderate sedation per anesthesia. Due to technical difficulty of the procedure (patient coughing, narrow entry to  larynx, utilization of EBUS bronchoscope), bronchoscopy was aborted and unable to obtain tissue sampling. I have  communicated with primary team, oncology and IR regarding feasibility of obtaining adequate tissue sampling if needed.   PET scan would be needed beforehand if IR is to perform lung biopsy to differentiate between tumor vs atelectatic lung. After speaking with IR, team felt adequate soft tissue was obtained from initial bone biopsy. Pathology preliminary results are available and I have asked Oncology to review results. If this is sufficient, no further invasive procedure indicated.  Pulmonary available as needed. Please contact team if mediastinal/hilar biopsy still desired.  Care Time: 35 minutes  Bobby Pope, M.D. [facility] Pulmonary/Critical Care Medicine 05/21/2021 2:28 PM   Please see Amion for pager number to reach on-call Pulmonary and Critical Care Team.

## 2021-05-21 NOTE — Plan of Care (Signed)
  Problem: Education: Goal: Knowledge of General Education information will improve Description: Including pain rating scale, medication(s)/side effects and non-pharmacologic comfort measures Outcome: Adequate for Discharge   

## 2021-05-21 NOTE — Anesthesia Preprocedure Evaluation (Signed)
Anesthesia Evaluation  Patient identified by MRN, date of birth, ID band Patient awake    Reviewed: Allergy & Precautions, NPO status , Patient's Chart, lab work & pertinent test results  Airway Mallampati: II  TM Distance: >3 FB Neck ROM: Full    Dental no notable dental hx.    Pulmonary Current Smoker and Patient abstained from smoking.,  Almost complete heterogeneous opacification of the left hemithorax with associated pleural thickening and nodularity. Underlying pulmonary mass is difficult to measure. Invasion into the mediastinum is noted. Associated narrowing of the left main pulmonary artery as well as left mainstem bronchus. Associated loculated left pleural effusion.  Several right lower lobe pulmonary nodule measuring up to 0.9 x 0.5 cm (4:121). Calcified pulmonary nodule within the right upper lobe. Pulmonary micronodule within the right upper lobe (4:35).  Debris within the right mainstem bronchus and trachea. As well as within the right lower lobe bronchials    + decreased breath sounds      Cardiovascular negative cardio ROS Normal cardiovascular exam Rhythm:Regular Rate:Normal     Neuro/Psych negative neurological ROS  negative psych ROS   GI/Hepatic negative GI ROS, Neg liver ROS,   Endo/Other  hypercalcemia  Renal/GU negative Renal ROS  negative genitourinary   Musculoskeletal negative musculoskeletal ROS (+)   Abdominal   Peds negative pediatric ROS (+)  Hematology  (+) anemia ,   Anesthesia Other Findings   Reproductive/Obstetrics negative OB ROS                             Anesthesia Physical Anesthesia Plan  ASA: IV  Anesthesia Plan: MAC   Post-op Pain Management:    Induction: Intravenous  PONV Risk Score and Plan: 1  Airway Management Planned: Simple Face Mask  Additional Equipment:   Intra-op Plan:   Post-operative Plan:   Informed  Consent: I have reviewed the patients History and Physical, chart, labs and discussed the procedure including the risks, benefits and alternatives for the proposed anesthesia with the patient or authorized representative who has indicated his/her understanding and acceptance.     Dental advisory given  Plan Discussed with: CRNA and Surgeon  Anesthesia Plan Comments: (Due to L mainstem obstruction and mediastinal involvement will proceed with MAC)        Anesthesia Quick Evaluation

## 2021-05-22 ENCOUNTER — Encounter: Payer: Self-pay | Admitting: *Deleted

## 2021-05-22 ENCOUNTER — Other Ambulatory Visit: Payer: Self-pay | Admitting: Hematology and Oncology

## 2021-05-22 DIAGNOSIS — C3492 Malignant neoplasm of unspecified part of left bronchus or lung: Secondary | ICD-10-CM

## 2021-05-22 DIAGNOSIS — C3490 Malignant neoplasm of unspecified part of unspecified bronchus or lung: Secondary | ICD-10-CM

## 2021-05-22 LAB — CBC WITH DIFFERENTIAL/PLATELET
Abs Immature Granulocytes: 0.02 10*3/uL (ref 0.00–0.07)
Basophils Absolute: 0 10*3/uL (ref 0.0–0.1)
Basophils Relative: 0 %
Eosinophils Absolute: 0 10*3/uL (ref 0.0–0.5)
Eosinophils Relative: 1 %
HCT: 38.5 % — ABNORMAL LOW (ref 39.0–52.0)
Hemoglobin: 12.1 g/dL — ABNORMAL LOW (ref 13.0–17.0)
Immature Granulocytes: 0 %
Lymphocytes Relative: 12 %
Lymphs Abs: 0.7 10*3/uL (ref 0.7–4.0)
MCH: 28.8 pg (ref 26.0–34.0)
MCHC: 31.4 g/dL (ref 30.0–36.0)
MCV: 91.7 fL (ref 80.0–100.0)
Monocytes Absolute: 0.7 10*3/uL (ref 0.1–1.0)
Monocytes Relative: 12 %
Neutro Abs: 4.4 10*3/uL (ref 1.7–7.7)
Neutrophils Relative %: 75 %
Platelets: 297 10*3/uL (ref 150–400)
RBC: 4.2 MIL/uL — ABNORMAL LOW (ref 4.22–5.81)
RDW: 15.1 % (ref 11.5–15.5)
WBC: 5.8 10*3/uL (ref 4.0–10.5)
nRBC: 0 % (ref 0.0–0.2)

## 2021-05-22 LAB — COMPREHENSIVE METABOLIC PANEL
ALT: 9 U/L (ref 0–44)
AST: 16 U/L (ref 15–41)
Albumin: 2.6 g/dL — ABNORMAL LOW (ref 3.5–5.0)
Alkaline Phosphatase: 72 U/L (ref 38–126)
Anion gap: 10 (ref 5–15)
BUN: 14 mg/dL (ref 8–23)
CO2: 28 mmol/L (ref 22–32)
Calcium: 8.9 mg/dL (ref 8.9–10.3)
Chloride: 99 mmol/L (ref 98–111)
Creatinine, Ser: 0.72 mg/dL (ref 0.61–1.24)
GFR, Estimated: >60 mL/min (ref >60)
Glucose, Bld: 93 mg/dL (ref 70–99)
Potassium: 3.7 mmol/L (ref 3.5–5.1)
Sodium: 137 mmol/L (ref 135–145)
Total Bilirubin: 0.4 mg/dL (ref 0.3–1.2)
Total Protein: 6.1 g/dL — ABNORMAL LOW (ref 6.5–8.1)

## 2021-05-22 LAB — PHOSPHORUS: Phosphorus: 1.8 mg/dL — ABNORMAL LOW (ref 2.5–4.6)

## 2021-05-22 LAB — MAGNESIUM: Magnesium: 1.6 mg/dL — ABNORMAL LOW (ref 1.7–2.4)

## 2021-05-22 LAB — SURGICAL PATHOLOGY

## 2021-05-22 MED ORDER — POTASSIUM PHOSPHATES 15 MMOLE/5ML IV SOLN
20.0000 mmol | Freq: Once | INTRAVENOUS | Status: AC
  Administered 2021-05-22: 20 mmol via INTRAVENOUS
  Filled 2021-05-22: qty 6.67

## 2021-05-22 MED ORDER — MAGNESIUM SULFATE 2 GM/50ML IV SOLN
2.0000 g | Freq: Once | INTRAVENOUS | Status: AC
  Administered 2021-05-22: 2 g via INTRAVENOUS
  Filled 2021-05-22: qty 50

## 2021-05-22 NOTE — Progress Notes (Signed)
Per Dr. Chryl Heck, she would like molecular and PDL 1 testing to go to [facility]. I notified pathology to send.

## 2021-05-22 NOTE — Progress Notes (Signed)
PROGRESS NOTE    Bobby Pope  MCN:[id number] DOB: 1956-09-05 DOA: 05/17/2021 PCP: Pcp, No   Brief Narrative:  The patient is a 65 year old thin chronically ill-appearing AAM who is a current smoker and denies any other medical history who presented to the emergency room for evaluation of worsening back pain.  His sister also raise concern for unintentional weight loss for the last few months and patient does not know how much she lost with thinks is over 30 pounds. 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 -Discussed with oncology.  Seen by interventional radiology underwent biopsy of bony lesion in the right iliac.   -MRI brain did not show any definite intracranial metastatic disease.   -Pulmonology was also consulted.  They attempted and bronchoscopy for biopsy however could not pass the scope beyond the vocal cord area.  So the procedure had to be aborted.   -Thoracentesis was considered however could not be adequately done due to lack of appropriate fluid.  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 Await further oncology input to see if patient warrants treatment while he is in the hospital.  Acute Respiratory Failure with Hypoxia Secondary to above.  Currently does not have any oxygen requirements.  Normocytic Anemia Hemoglobin low  but stable.  No overt loss noted.  Hypercalcemialikely of Malignancy  -Serum calcium 14.3 on admission Patient was given Zometa calcitonin and IV fluids.  Calcium level improved to 8.9.  Hypomagnesemia/hypophosphatemia Continue to supplement.  Severe protein calorie malnutrition Nutrition Problem: Severe Malnutrition Etiology: acute illness (likely new dx of cancer) Signs/Symptoms: severe fat depletion,severe muscle depletion Interventions: Ensure Enlive (each supplement provides 350kcal and 20 grams of protein)     DVT prophylaxis: SCDs Code Status: FULL CODE  Family Communication: Discussed with patient.  No family at bedside Disposition Plan: Mobilizing.   Status is: Inpatient  Remains inpatient appropriate because:Unsafe d/c plan, IV treatments appropriate due to intensity of illness or inability to take PO and Inpatient level of care appropriate due to severity of illness   Dispo: The patient is from: Home              Anticipated d/c is to: TBD              Patient currently is not medically stable to d/c.   Difficult to place patient No  Consultants:   Cardiothroacic Surgery  Pulmonary  Interventional Radiology  Medical Oncology    Procedures: CT-guided core biopsy of right iliac bone lesion  Antimicrobials: Anti-infectives (From admission, onward)   None       Subjective: Patient denies any complaints.  No chest pain or shortness of breath.  No nausea or vomiting currently.  Objective: Vitals:   05/21/21 1440 05/21/21 2041 05/22/21 0449 05/22/21 0500  BP: (!) 141/85 114/76 (!) 141/82   Pulse: 80 74 76   Resp: (!) 21 18 18    Temp:  98.3 F (36.8 C) 98.4 F (  36.9 C)   TempSrc:  Oral Oral   SpO2: 92% 92% 96%   Weight:    69.5 kg  Height:        Intake/Output Summary (Last 24 hours) at 05/22/2021 1130 Last data filed at 05/22/2021 0601 Gross per 24 hour  Intake 640 ml  Output 200 ml  Net 440 ml   Filed Weights   05/19/21 0500 05/20/21  0500 05/22/21 0500  Weight: 61.8 kg 64.5 kg 69.5 kg    Examination:  General appearance: Awake alert.  In no distress Resp: Normal effort at rest.  Diminished air entry on the left.  Crackles at the bases bilaterally.  No wheezing or rhonchi. Cardio: S1-S2 is normal regular.  No S3-S4.  No rubs murmurs or bruit GI: Abdomen is soft.  Nontender nondistended.  Bowel sounds are present normal.  No masses organomegaly Extremities: No edema.  Full range of motion of lower extremities. Neurologic: Alert and oriented x3.  No focal neurological deficits.    Data Reviewed: I have personally reviewed following labs and imaging studies  CBC: Recent Labs  Lab 05/18/21 0850 05/19/21 0243 05/20/21 0335 05/21/21 0349 05/22/21 0403  WBC 9.3 7.1 7.2 5.4 5.8  NEUTROABS 7.8* 5.7 5.8 4.1 4.4  HGB 13.7 13.2 12.0* 11.5* 12.1*  HCT 42.5 42.0 38.4* 36.9* 38.5*  MCV 91.6 93.3 93.9 94.1 91.7  PLT 335 332 274 271 462   Basic Metabolic Panel: Recent Labs  Lab 05/18/21 0850 05/19/21 0243 05/20/21 0335 05/21/21 0349 05/22/21 0403  NA 138 138 139 136 137  K 4.6 4.2 3.8 3.7 3.7  CL 97* 100 103 101 99  CO2 28 28 27 26 28   GLUCOSE 109* 90 84 84 93  BUN 33* 30* 21 17 14   CREATININE 0.83 0.82 0.56* 0.64 0.72  CALCIUM 13.3* 11.5* 9.9 9.0 8.9  MG 1.5* 1.8 1.9 1.7 1.6*  PHOS 3.1 1.5* 1.7* 1.9* 1.8*   GFR: Estimated Creatinine Clearance: 91.7 mL/min (by C-G formula based on SCr of 0.72 mg/dL). Liver Function Tests: Recent Labs  Lab 05/18/21 0850 05/19/21 0243 05/20/21 0335 05/21/21 0349 05/22/21 0403  AST 19 16 12* 13* 16  ALT 11 10 10 9 9   ALKPHOS 81 77 65 63 72  BILITOT 0.9 0.6 0.8 0.4 0.4  PROT 7.1 6.8 6.1* 5.8* 6.1*  ALBUMIN 3.4* 3.1* 2.9* 2.6* 2.6*   Coagulation Profile: Recent Labs  Lab 05/21/21 0349  INR 1.0     Recent Results (from the past 240 hour(s))  Resp Panel by RT-PCR (Flu A&B, Covid) Nasopharyngeal Swab     Status: None   Collection Time: 05/18/21 12:18 AM    Specimen: Nasopharyngeal Swab; Nasopharyngeal(NP) swabs in vial transport medium  Result Value Ref Range Status   SARS Coronavirus 2 by RT PCR NEGATIVE NEGATIVE Final    Comment: (NOTE) SARS-CoV-2 target nucleic acids are NOT DETECTED.  The SARS-CoV-2 RNA is generally detectable in upper respiratory specimens during the acute phase of infection. The lowest concentration of SARS-CoV-2 viral copies this assay can detect is 138 copies/mL. A negative result does not preclude SARS-Cov-2 infection and should not be used as the sole basis for treatment or other patient management decisions. A negative result may occur with  improper specimen collection/handling, submission of specimen other than nasopharyngeal swab, presence of viral mutation(s) within the areas targeted by this assay, and inadequate number of viral copies(<138 copies/mL). A negative result must be combined with clinical observations, patient history, and epidemiological information. The  expected result is Negative.  Fact Sheet for Patients:  EntrepreneurPulse.com.au  Fact Sheet for Healthcare Providers:  IncredibleEmployment.be  This test is no t yet approved or cleared by the [place] FDA and  has been authorized for detection and/or diagnosis of SARS-CoV-2 by FDA under an Emergency Use Authorization (EUA). This EUA will remain  in effect (meaning this test can be used) for the duration of the COVID-19 declaration under Section 564(b)(1) of the Act, 21 U.S.C.section 360bbb-3(b)(1), unless the authorization is terminated  or revoked sooner.       Influenza A by PCR NEGATIVE NEGATIVE Final   Influenza B by PCR NEGATIVE NEGATIVE Final    Comment: (NOTE) The Xpert Xpress SARS-CoV-2/FLU/RSV plus assay is intended as an aid in the diagnosis of influenza from Nasopharyngeal swab specimens and should not be used as a sole basis for treatment. Nasal washings and aspirates are  unacceptable for Xpert Xpress SARS-CoV-2/FLU/RSV testing.  Fact Sheet for Patients: EntrepreneurPulse.com.au  Fact Sheet for Healthcare Providers: IncredibleEmployment.be  This test is not yet approved or cleared by the [place] FDA and has been authorized for detection and/or diagnosis of SARS-CoV-2 by FDA under an Emergency Use Authorization (EUA). This EUA will remain in effect (meaning this test can be used) for the duration of the COVID-19 declaration under Section 564(b)(1) of the Act, 21 U.S.C. section 360bbb-3(b)(1), unless the authorization is terminated or revoked.  Performed at [facility], [place] [address], [place], [place] [address]      Radiology Studies: CT BIOPSY  Result Date: 06/11/21 INDICATION: 65 year old male with significant left hemithorax malignant process and multifocal lytic osseous lesions concerning for primary bronchogenic carcinoma with bone metastases. He presents for CT-guided biopsy of 1 of the lytic bone lesions. EXAM: CT-guided core biopsy right iliac bone lesion MEDICATIONS: None. ANESTHESIA/SEDATION: Moderate (conscious) sedation was employed during this procedure. A total of Versed 1 mg and Fentanyl 50 mcg was administered intravenously. Moderate Sedation Time: 10 minutes. The patient's level of consciousness and vital signs were monitored continuously by radiology nursing throughout the procedure under my direct supervision. FLUOROSCOPY TIME:  None. COMPLICATIONS: None immediate. PROCEDURE: Informed written consent was obtained from the patient after a thorough discussion of the procedural risks, benefits and alternatives. All questions were addressed. Maximal Sterile Barrier Technique was utilized including caps, mask, sterile gowns, sterile gloves, sterile drape, hand hygiene and skin antiseptic. A timeout was performed prior to the initiation of the procedure. A planning CT scan was  performed. The lytic lesion in the right posterior iliac bone was identified. The overlying skin was prepped and marked. Local anesthesia was attained by infiltration with 1% lidocaine. A small dermatotomy was made. Under intermittent CT guidance, a 13 gauge trocar needle was advanced through the cortex and positioned at the margin of the lytic component of the mass. Multiple 11 gauge core biopsies were then obtained coaxially using the OnControl biopsy device. Biopsy specimens were placed in formalin and delivered to pathology for further analysis. IMPRESSION: Technically successful CT-guided core biopsy of right lytic iliac bone lesion. Electronically Signed   By: Jacqulynn Cadet M.D.   On: 06/11/2021 12:32   DG CHEST PORT 1 VIEW  Result Date: 05/21/2021 CLINICAL DATA:  Fall.  Back pain.  Shortness of breath. EXAM: PORTABLE CHEST 1 VIEW COMPARISON:  06/11/21.  CT 05/18/2021. FINDINGS: Complete opacification of the left hemithorax with volume loss again noted. No interim change. Right lung pulmonary nodules best identified by prior CT. No pneumothorax.  Heart size cannot be assessed. Lytic lesions of the left sixth and seventh ribs again noted. This is again consistent metastatic disease. IMPRESSION: 1. Complete opacification of the left hemithorax with volume loss again noted. No interim change. Right pulmonary nodules best identified by prior CT. Chest is unchanged from prior exam. 2. Lytic lesions of the left sixth and seventh ribs again noted. This is again consistent with metastatic disease. Electronically Signed   By: Marcello Moores  Register   On: 05/21/2021 06:01   Scheduled Meds: . feeding supplement  237 mL Oral BID BM   Continuous Infusions: . lactated ringers 125 mL/hr at 05/22/21 0601  . methocarbamol (ROBAXIN) IV    . potassium PHOSPHATE IVPB (in mmol) 20 mmol (05/22/21 1010)    LOS: 4 days    Bonnielee Haff,   [facility] PAGER is on [place]  If 7PM-7AM, please contact  night-coverage [email or web address]

## 2021-05-22 NOTE — Plan of Care (Signed)
  Problem: Clinical Measurements: Goal: Will remain free from infection Outcome: Progressing Goal: Respiratory complications will improve Outcome: Progressing   Problem: Nutrition: Goal: Adequate nutrition will be maintained Outcome: Progressing   Problem: Pain Managment: Goal: General experience of comfort will improve Outcome: Progressing   

## 2021-05-22 NOTE — Consult Note (Signed)
[place]  [phone] Fax:([phone] [place]    Referral MD  Reason for Referral: New diagnosis of squamous cell lung carcinoma.  Chief Complaint  Patient presents with  . Back Pain  . Fall   HPI:    This is a very pleasant 65 year old male patient, heavy smoker, presented to the ED with chief complaint of back pain and ongoing weight loss.  He mentions that the back pain has been most severe in the past 1 month and a half.  He has lost about 40 pounds in the past couple months.  He denies any worsening shortness of breath, he does have some nonproductive cough.  His back pain bothers him the most.  He points it to the lower back, no pain radiating to his lower extremities.  No weakness in his lower legs.  No stool or bladder incontinence or retention that he reports.  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  He lives with his son.  He used to smoke heavily but has cut down the past couple months when he started feeling sick.  Rest of the 10 point review of systems reviewed and negative.   History reviewed. No pertinent past medical history.:  History reviewed. No pertinent surgical history.:  Current Facility-Administered Medications  Medication Dose Route Frequency Provider Last Rate Last Admin  . acetaminophen (TYLENOL) tablet 650 mg  650 mg Oral Q6H PRN Opyd, Ilene Qua, MD   650 mg at 05/22/21 1529   Or  . acetaminophen (TYLENOL) suppository 650 mg  650 mg Rectal Q6H PRN Opyd, Ilene Qua, MD      . bisacodyl (DULCOLAX) EC tablet 5 mg  5 mg Oral Daily PRN Opyd, Ilene Qua, MD      . feeding supplement (ENSURE ENLIVE / ENSURE PLUS) liquid 237 mL  237 mL Oral BID BM Sheikh, Omair Latif, DO   237 mL at 05/22/21 1009  . guaiFENesin-dextromethorphan (ROBITUSSIN DM) 100-10 MG/5ML syrup 5 mL  5 mL Oral Q4H PRN Raiford Noble [place], DO   5 mL  at 05/21/21 2207  . HYDROcodone-acetaminophen (NORCO/VICODIN) 5-325 MG per tablet 1-2 tablet  1-2 tablet Oral Q4H PRN Opyd, Ilene Qua, MD   2 tablet at 05/21/21 1529  . HYDROmorphone (DILAUDID) injection 0.5-1 mg  0.5-1 mg Intravenous Q4H PRN Opyd, Ilene Qua, MD   1 mg at 05/19/21 1718  . lactated ringers infusion   Intravenous Continuous Bonnielee Haff, MD 50 mL/hr at 05/22/21 1237 Rate Change at 05/22/21 1237  . LORazepam (ATIVAN) injection 1 mg  1 mg Intravenous Once PRN Alfredia Ferguson, Omair Latif, DO      . menthol-cetylpyridinium (CEPACOL) lozenge 3 mg  1 lozenge Oral PRN Raiford Noble [place], DO   3 mg at 05/21/21 2209  . methocarbamol (ROBAXIN) 500 mg in dextrose 5 % 50 mL IVPB  500 mg Intravenous Q6H PRN Opyd, Ilene Qua, MD      . ondansetron (ZOFRAN) tablet 4 mg  4 mg Oral Q6H PRN Opyd, Ilene Qua, MD       Or  . ondansetron (ZOFRAN) injection 4 mg  4 mg Intravenous Q6H PRN Opyd, Ilene Qua, MD      . senna-docusate (Senokot-S) tablet 1 tablet  1 tablet Oral QHS PRN Opyd, Ilene Qua, MD         No Known Allergies:  Family History  Family  history unknown: Yes  :  Social History   Socioeconomic History  . Marital status: Single    Spouse name: Not on file  . Number of children: Not on file  . Years of education: Not on file  . Highest education level: Not on file  Occupational History  . Not on file  Tobacco Use  . Smoking status: Current Every Day Smoker    Packs/day: 0.25    Types: Cigarettes  . Smokeless tobacco: Never Used  Substance and Sexual Activity  . Alcohol use: Yes    Comment: occasional, denies hx of withdrawal   . Drug use: Not on file  . Sexual activity: Not on file  Other Topics Concern  . Not on file  Social History Narrative  . Not on file   Social Determinants of Health   Financial Resource Strain: Not on file  Food Insecurity: Not on file  Transportation Needs: Not on file  Physical Activity: Not on file  Stress: Not on file  Social Connections:  Not on file  Intimate Partner Violence: Not on file    Exam: Patient Vitals for the past 24 hrs:  BP Temp Temp src Pulse Resp SpO2 Weight  05/22/21 1726 137/90 98.4 F (36.9 C) -- 85 16 93 % --  05/22/21 1221 (!) 141/86 98.5 F (36.9 C) Oral 85 18 100 % --  05/22/21 0500 -- -- -- -- -- -- 153 lb 4.8 oz (69.5 kg)  05/22/21 0449 (!) 141/82 98.4 F (36.9 C) Oral 76 18 96 % --  05/21/21 2041 114/76 98.3 F (36.8 C) Oral 74 18 92 % --   Physical Exam Constitutional:      Appearance: He is ill-appearing (Appears frail and cachectic).     Comments: He was able to have a conversation without getting short of breath.  He was not on oxygen at the time of my conversation.  HENT:     Head: Normocephalic and atraumatic.     Mouth/Throat:     Mouth: Mucous membranes are moist.  Eyes:     Extraocular Movements: Extraocular movements intact.  Cardiovascular:     Rate and Rhythm: Normal rate and regular rhythm.     Pulses: Normal pulses.     Heart sounds: Normal heart sounds.  Pulmonary:     Effort: Pulmonary effort is normal.     Breath sounds: Normal breath sounds.  Abdominal:     General: Abdomen is flat. Bowel sounds are normal.     Palpations: Abdomen is soft.  Musculoskeletal:        General: No swelling or tenderness.     Cervical back: Normal range of motion and neck supple. No rigidity or tenderness.  Lymphadenopathy:     Cervical: No cervical adenopathy.  Skin:    General: Skin is warm and dry.  Neurological:     Mental Status: He is alert.       Lab Results  Component Value Date   WBC 5.8 05/22/2021   HGB 12.1 (L) 05/22/2021   HCT 38.5 (L) 05/22/2021   PLT 297 05/22/2021   GLUCOSE 93 05/22/2021   ALT 9 05/22/2021   AST 16 05/22/2021   NA 137 05/22/2021   K 3.7 05/22/2021   CL 99 05/22/2021   CREATININE 0.72 05/22/2021   BUN 14 05/22/2021   CO2 28 05/22/2021    DG Chest 2 View  Result Date: 05/17/2021 CLINICAL DATA:  Lower back pain, status post fall.  EXAM: CHEST -  2 VIEW COMPARISON:  None. FINDINGS: There is complete opacification of the left hemithorax with a very small amount of aerated lung noted within the left lung base. The right lung is mildly hyperinflated and otherwise clear. Right to left shift of the cardiac silhouette is noted which is subsequently limited in evaluation. There is marked severity calcification of the thoracic aorta. Ill-defined deformities of the third, sixth and seventh left ribs are seen. IMPRESSION: Findings likely consistent with partial collapse of the left lung with associated atelectasis, infiltrate and pleural effusion. Correlation with chest CT is recommended, as an underlying mass cannot be excluded. Electronically Signed   By: Virgina Norfolk M.D.   On: 05/17/2021 23:28   DG Lumbar Spine Complete  Result Date: 05/17/2021 CLINICAL DATA:  Status post fall with lower back pain. EXAM: LUMBAR SPINE - COMPLETE 4+ VIEW COMPARISON:  None. FINDINGS: There is no evidence of acute lumbar spine fracture. Alignment is normal. Mild multilevel endplate sclerosis is seen. Intervertebral disc spaces are maintained. Moderate severity calcification of the abdominal aorta is noted. IMPRESSION: Multilevel degenerative changes without an acute lumbar spine fracture or subluxation. Electronically Signed   By: Virgina Norfolk M.D.   On: 05/17/2021 23:33   MR BRAIN W WO CONTRAST  Result Date: 05/18/2021 CLINICAL DATA:  Left chest malignancy.  Staging. EXAM: MRI HEAD WITHOUT AND WITH CONTRAST TECHNIQUE: Multiplanar, multiecho pulse sequences of the brain and surrounding structures were obtained without and with intravenous contrast. CONTRAST:  18mL GADAVIST GADOBUTROL 1 MMOL/ML IV SOLN COMPARISON:  None. FINDINGS: Brain: The brain itself has a normal appearance without evidence atrophy, old or acute small or large vessel infarction, primary or metastatic mass lesion, hemorrhage, hydrocephalus or extra-axial collection. No abnormal contrast  enhancement occurs. Vascular: Major vessels at the base of the brain show flow. Skull and upper cervical spine: Question abnormal signal in the tip of the clivus in the dens. Osseous metastatic disease not excluded. Sinuses/Orbits: Opacified maxillary sinuses, more extensively on the right than the left. Orbits negative. Other: Frontal scalp lipoma. IMPRESSION: Motion degraded study, but without evidence of intracranial metastatic disease. Question abnormal marrow signal at the tip of the clivus and affecting the dens. Metastatic disease to the bones of these levels not excluded. Maxillary sinus opacification/inflammatory change, worse on the right than the left. Electronically Signed   By: Nelson Chimes M.D.   On: 05/18/2021 17:19   CT CHEST ABDOMEN PELVIS W CONTRAST  Result Date: 05/18/2021 CLINICAL DATA:  Lower back pain status post fall. Partial collapse of left lung with associated infiltrate and pleural effusion. Question underlying mass on chest x-ray. EXAM: CT CHEST, ABDOMEN, AND PELVIS WITH CONTRAST CT LUMBAR SPINE WITHOUT CONTRAST TECHNIQUE: Multidetector CT imaging of the chest, abdomen and pelvis was performed following the standard protocol during bolus administration of intravenous contrast. Multidetector CT imaging of the lumbar spine was performed without intravenous contrast administration. Multiplanar CT image reconstructions were also generated. CONTRAST:  16mL OMNIPAQUE IOHEXOL 300 MG/ML  SOLN COMPARISON:  Chest x-ray 05/17/2021 FINDINGS: CT CHEST FINDINGS Cardiovascular: Normal heart size. No significant pericardial effusion. The thoracic aorta is normal in caliber. Mild-to-moderate atherosclerotic plaque of the thoracic aorta. At least 3 vessel coronary artery calcifications. The main pulmonary artery is enlarged in caliber measuring up to 3.7 cm. No central or proximal segmental pulmonary embolus. Mediastinum/Nodes: Mediastinal lymphadenopathy with as an example a 1.5 cm precarinal lymph  node (2:27). Also noted subcarinal lymph node measuring 1.6 cm (2:33). No definite right hilar lymphadenopathy.  Left hilar lymphadenopathy unable to measure. Lungs/Pleura: Almost complete heterogeneous opacification of the left hemithorax with associated pleural thickening and nodularity. Underlying pulmonary mass is difficult to measure. Invasion into the mediastinum is noted. Associated narrowing of the left main pulmonary artery as well as left mainstem bronchus. Associated loculated left pleural effusion. Several right lower lobe pulmonary nodule measuring up to 0.9 x 0.5 cm (4:121). Calcified pulmonary nodule within the right upper lobe. Pulmonary micronodule within the right upper lobe (4:35). Debris within the right mainstem bronchus and trachea. As well as within the right lower lobe bronchials. Musculoskeletal: Scattered axial and appendicular skeleton lytic lesions. Question pathologic nondisplaced fracture of the right posterior tenth rib (2:49). CT ABDOMEN PELVIS FINDINGS Hepatobiliary: No focal liver abnormality. No gallstones, gallbladder wall thickening, or pericholecystic fluid. No biliary dilatation. Pancreas: No focal lesion. Normal pancreatic contour. No surrounding inflammatory changes. No main pancreatic ductal dilatation. Spleen: Normal in size without focal abnormality. Adrenals/Urinary Tract: No right adrenal nodule. There is a retroperitoneal left upper 3.3 x 2.1 cm lobulated lesion that may be arising from the left adrenal gland. Bilateral kidneys enhance symmetrically. No hydronephrosis. No hydroureter. The urinary bladder is unremarkable. Stomach/Bowel: Stomach is within normal limits. No evidence of bowel wall thickening or dilatation. Appendix appears normal. Vascular/Lymphatic: No abdominal aorta or iliac aneurysm. Moderate atherosclerotic plaque of the aorta and its branches. No pelvic or inguinal lymphadenopathy. Reproductive: The prostate is enlarged measuring up to 5.1 cm. Other:  No intraperitoneal free fluid. No intraperitoneal free gas. No organized fluid collection. Musculoskeletal: Scattered axial and appendicular skeleton lytic lesions. CT LUMBAR SPINE: Segmentation: 5 non-rib-bearing lumbar vertebral bodies. Alignment: Normal. Vertebra: Several lytic lesions with cortical destruction, as an example: Posterior wall and inferior endplate of the L4 level (504:22), right iliac bone (3:97), S1 level (504:22, 3:101). Soft tissues: Question associated soft tissue densities along the lytic lesions with as an example along the right iliac bone (2:91). IMPRESSION: 1. 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 Underlying pulmonary mass difficult to measure/visualize. Associated loculated left pleural effusion. Invasion into the mediastinum with associated narrowing of the left main pulmonary artery as well as left mainstem bronchus. 2. Mediastinal and left hilar lymphadenopathy. 3. Several right lower lobe pulmonary nodules measuring up to 0.9 x 0.5 cm. Findings could represent metastases versus infection/inflammation given debris within the right mainstem bronchus and trachea as well as within the right lower lobe bronchioles with associated right lower lobe mucous plugging. 4. Axial and appendicular skeleton lytic lesions with cortical destruction consistent with metastases. 5. Question pathologic nondisplaced fracture of the right posterior tenth rib. 6. A left retroperitoneal 3.3 x 2.1 cm lobulated lesion may be arising from the left adrenal gland versus represent a lymph node. Finding likely metastasis. 7. Prostatomegaly. Electronically Signed   By: Iven Finn M.D.   On: 05/18/2021 02:05   CT L-SPINE NO CHARGE  Result Date: 05/18/2021 CLINICAL DATA:  Lower back pain status post fall. Partial collapse of left lung with associated infiltrate and pleural effusion. Question underlying mass on chest x-ray. EXAM: CT CHEST,  ABDOMEN, AND PELVIS WITH CONTRAST CT LUMBAR SPINE WITHOUT CONTRAST TECHNIQUE: Multidetector CT imaging of the chest, abdomen and pelvis was performed following the standard protocol during bolus administration of intravenous contrast. Multidetector CT imaging of the lumbar spine was performed without intravenous contrast administration. Multiplanar CT image reconstructions were also generated. CONTRAST:  144mL OMNIPAQUE IOHEXOL 300 MG/ML  SOLN COMPARISON:  Chest x-ray 05/17/2021 FINDINGS: CT  CHEST FINDINGS Cardiovascular: Normal heart size. No significant pericardial effusion. The thoracic aorta is normal in caliber. Mild-to-moderate atherosclerotic plaque of the thoracic aorta. At least 3 vessel coronary artery calcifications. The main pulmonary artery is enlarged in caliber measuring up to 3.7 cm. No central or proximal segmental pulmonary embolus. Mediastinum/Nodes: Mediastinal lymphadenopathy with as an example a 1.5 cm precarinal lymph node (2:27). Also noted subcarinal lymph node measuring 1.6 cm (2:33). No definite right hilar lymphadenopathy. Left hilar lymphadenopathy unable to measure. Lungs/Pleura: Almost complete heterogeneous opacification of the left hemithorax with associated pleural thickening and nodularity. Underlying pulmonary mass is difficult to measure. Invasion into the mediastinum is noted. Associated narrowing of the left main pulmonary artery as well as left mainstem bronchus. Associated loculated left pleural effusion. Several right lower lobe pulmonary nodule measuring up to 0.9 x 0.5 cm (4:121). Calcified pulmonary nodule within the right upper lobe. Pulmonary micronodule within the right upper lobe (4:35). Debris within the right mainstem bronchus and trachea. As well as within the right lower lobe bronchials. Musculoskeletal: Scattered axial and appendicular skeleton lytic lesions. Question pathologic nondisplaced fracture of the right posterior tenth rib (2:49). CT ABDOMEN PELVIS  FINDINGS Hepatobiliary: No focal liver abnormality. No gallstones, gallbladder wall thickening, or pericholecystic fluid. No biliary dilatation. Pancreas: No focal lesion. Normal pancreatic contour. No surrounding inflammatory changes. No main pancreatic ductal dilatation. Spleen: Normal in size without focal abnormality. Adrenals/Urinary Tract: No right adrenal nodule. There is a retroperitoneal left upper 3.3 x 2.1 cm lobulated lesion that may be arising from the left adrenal gland. Bilateral kidneys enhance symmetrically. No hydronephrosis. No hydroureter. The urinary bladder is unremarkable. Stomach/Bowel: Stomach is within normal limits. No evidence of bowel wall thickening or dilatation. Appendix appears normal. Vascular/Lymphatic: No abdominal aorta or iliac aneurysm. Moderate atherosclerotic plaque of the aorta and its branches. No pelvic or inguinal lymphadenopathy. Reproductive: The prostate is enlarged measuring up to 5.1 cm. Other: No intraperitoneal free fluid. No intraperitoneal free gas. No organized fluid collection. Musculoskeletal: Scattered axial and appendicular skeleton lytic lesions. CT LUMBAR SPINE: Segmentation: 5 non-rib-bearing lumbar vertebral bodies. Alignment: Normal. Vertebra: Several lytic lesions with cortical destruction, as an example: Posterior wall and inferior endplate of the L4 level (504:22), right iliac bone (3:97), S1 level (504:22, 3:101). Soft tissues: Question associated soft tissue densities along the lytic lesions with as an example along the right iliac bone (2:91). IMPRESSION: 1. 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 Underlying pulmonary mass difficult to measure/visualize. Associated loculated left pleural effusion. Invasion into the mediastinum with associated narrowing of the left main pulmonary artery as well as left mainstem bronchus. 2. Mediastinal and left hilar lymphadenopathy. 3. Several  right lower lobe pulmonary nodules measuring up to 0.9 x 0.5 cm. Findings could represent metastases versus infection/inflammation given debris within the right mainstem bronchus and trachea as well as within the right lower lobe bronchioles with associated right lower lobe mucous plugging. 4. Axial and appendicular skeleton lytic lesions with cortical destruction consistent with metastases. 5. Question pathologic nondisplaced fracture of the right posterior tenth rib. 6. A left retroperitoneal 3.3 x 2.1 cm lobulated lesion may be arising from the left adrenal gland versus represent a lymph node. Finding likely metastasis. 7. Prostatomegaly. Electronically Signed   By: Iven Finn M.D.   On: 05/18/2021 02:05   CT BIOPSY  Result Date: 05/20/2021 INDICATION: 65 year old male with significant left hemithorax malignant process and multifocal lytic osseous lesions concerning for primary bronchogenic carcinoma with  bone metastases. He presents for CT-guided biopsy of 1 of the lytic bone lesions. EXAM: CT-guided core biopsy right iliac bone lesion MEDICATIONS: None. ANESTHESIA/SEDATION: Moderate (conscious) sedation was employed during this procedure. A total of Versed 1 mg and Fentanyl 50 mcg was administered intravenously. Moderate Sedation Time: 10 minutes. The patient's level of consciousness and vital signs were monitored continuously by radiology nursing throughout the procedure under my direct supervision. FLUOROSCOPY TIME:  None. COMPLICATIONS: None immediate. PROCEDURE: Informed written consent was obtained from the patient after a thorough discussion of the procedural risks, benefits and alternatives. All questions were addressed. Maximal Sterile Barrier Technique was utilized including caps, mask, sterile gowns, sterile gloves, sterile drape, hand hygiene and skin antiseptic. A timeout was performed prior to the initiation of the procedure. A planning CT scan was performed. The lytic lesion in the right  posterior iliac bone was identified. The overlying skin was prepped and marked. Local anesthesia was attained by infiltration with 1% lidocaine. A small dermatotomy was made. Under intermittent CT guidance, a 13 gauge trocar needle was advanced through the cortex and positioned at the margin of the lytic component of the mass. Multiple 11 gauge core biopsies were then obtained coaxially using the OnControl biopsy device. Biopsy specimens were placed in formalin and delivered to pathology for further analysis. IMPRESSION: Technically successful CT-guided core biopsy of right lytic iliac bone lesion. Electronically Signed   By: Jacqulynn Cadet M.D.   On: 05/20/2021 12:32   DG CHEST PORT 1 VIEW  Result Date: 05/21/2021 CLINICAL DATA:  Fall.  Back pain.  Shortness of breath. EXAM: PORTABLE CHEST 1 VIEW COMPARISON:  05/20/2021.  CT 05/18/2021. FINDINGS: Complete opacification of the left hemithorax with volume loss again noted. No interim change. Right lung pulmonary nodules best identified by prior CT. No pneumothorax. Heart size cannot be assessed. Lytic lesions of the left sixth and seventh ribs again noted. This is again consistent metastatic disease. IMPRESSION: 1. Complete opacification of the left hemithorax with volume loss again noted. No interim change. Right pulmonary nodules best identified by prior CT. Chest is unchanged from prior exam. 2. Lytic lesions of the left sixth and seventh ribs again noted. This is again consistent with metastatic disease. Electronically Signed   By: Marcello Moores  Register   On: 05/21/2021 06:01   DG CHEST PORT 1 VIEW  Result Date: 05/20/2021 CLINICAL DATA:  Shortness of breath. EXAM: PORTABLE CHEST 1 VIEW COMPARISON:  May 17, 2021.  May 18, 2021. FINDINGS: There remains complete consolidation of the left hemithorax consistent with probable mass and effusion as noted on prior CT exam. Right lung is unremarkable. No definite pneumothorax is noted. Lytic lesions are seen involving  the lateral portions of the left sixth and seventh ribs consistent with metastatic disease. IMPRESSION: Stable complete consolidation of the left hemithorax as described above. Lytic lesions are noted in the left sixth and seventh ribs consistent with metastatic disease. Electronically Signed   By: Marijo Conception M.D.   On: 05/20/2021 08:45    DG Chest 2 View  Result Date: 05/17/2021 CLINICAL DATA:  Lower back pain, status post fall. EXAM: CHEST - 2 VIEW COMPARISON:  None. FINDINGS: There is complete opacification of the left hemithorax with a very small amount of aerated lung noted within the left lung base. The right lung is mildly hyperinflated and otherwise clear. Right to left shift of the cardiac silhouette is noted which is subsequently limited in evaluation. There is marked severity calcification of the  thoracic aorta. Ill-defined deformities of the third, sixth and seventh left ribs are seen. IMPRESSION: Findings likely consistent with partial collapse of the left lung with associated atelectasis, infiltrate and pleural effusion. Correlation with chest CT is recommended, as an underlying mass cannot be excluded. Electronically Signed   By: Virgina Norfolk M.D.   On: 05/17/2021 23:28   DG Lumbar Spine Complete  Result Date: 05/17/2021 CLINICAL DATA:  Status post fall with lower back pain. EXAM: LUMBAR SPINE - COMPLETE 4+ VIEW COMPARISON:  None. FINDINGS: There is no evidence of acute lumbar spine fracture. Alignment is normal. Mild multilevel endplate sclerosis is seen. Intervertebral disc spaces are maintained. Moderate severity calcification of the abdominal aorta is noted. IMPRESSION: Multilevel degenerative changes without an acute lumbar spine fracture or subluxation. Electronically Signed   By: Virgina Norfolk M.D.   On: 05/17/2021 23:33   MR BRAIN W WO CONTRAST  Result Date: 05/18/2021 CLINICAL DATA:  Left chest malignancy.  Staging. EXAM: MRI HEAD WITHOUT AND WITH CONTRAST TECHNIQUE:  Multiplanar, multiecho pulse sequences of the brain and surrounding structures were obtained without and with intravenous contrast. CONTRAST:  72mL GADAVIST GADOBUTROL 1 MMOL/ML IV SOLN COMPARISON:  None. FINDINGS: Brain: The brain itself has a normal appearance without evidence atrophy, old or acute small or large vessel infarction, primary or metastatic mass lesion, hemorrhage, hydrocephalus or extra-axial collection. No abnormal contrast enhancement occurs. Vascular: Major vessels at the base of the brain show flow. Skull and upper cervical spine: Question abnormal signal in the tip of the clivus in the dens. Osseous metastatic disease not excluded. Sinuses/Orbits: Opacified maxillary sinuses, more extensively on the right than the left. Orbits negative. Other: Frontal scalp lipoma. IMPRESSION: Motion degraded study, but without evidence of intracranial metastatic disease. Question abnormal marrow signal at the tip of the clivus and affecting the dens. Metastatic disease to the bones of these levels not excluded. Maxillary sinus opacification/inflammatory change, worse on the right than the left. Electronically Signed   By: Nelson Chimes M.D.   On: 05/18/2021 17:19   CT CHEST ABDOMEN PELVIS W CONTRAST  Result Date: 05/18/2021 CLINICAL DATA:  Lower back pain status post fall. Partial collapse of left lung with associated infiltrate and pleural effusion. Question underlying mass on chest x-ray. EXAM: CT CHEST, ABDOMEN, AND PELVIS WITH CONTRAST CT LUMBAR SPINE WITHOUT CONTRAST TECHNIQUE: Multidetector CT imaging of the chest, abdomen and pelvis was performed following the standard protocol during bolus administration of intravenous contrast. Multidetector CT imaging of the lumbar spine was performed without intravenous contrast administration. Multiplanar CT image reconstructions were also generated. CONTRAST:  141mL OMNIPAQUE IOHEXOL 300 MG/ML  SOLN COMPARISON:  Chest x-ray 05/17/2021 FINDINGS: CT CHEST FINDINGS  Cardiovascular: Normal heart size. No significant pericardial effusion. The thoracic aorta is normal in caliber. Mild-to-moderate atherosclerotic plaque of the thoracic aorta. At least 3 vessel coronary artery calcifications. The main pulmonary artery is enlarged in caliber measuring up to 3.7 cm. No central or proximal segmental pulmonary embolus. Mediastinum/Nodes: Mediastinal lymphadenopathy with as an example a 1.5 cm precarinal lymph node (2:27). Also noted subcarinal lymph node measuring 1.6 cm (2:33). No definite right hilar lymphadenopathy. Left hilar lymphadenopathy unable to measure. Lungs/Pleura: Almost complete heterogeneous opacification of the left hemithorax with associated pleural thickening and nodularity. Underlying pulmonary mass is difficult to measure. Invasion into the mediastinum is noted. Associated narrowing of the left main pulmonary artery as well as left mainstem bronchus. Associated loculated left pleural effusion. Several right lower lobe pulmonary nodule measuring  up to 0.9 x 0.5 cm (4:121). Calcified pulmonary nodule within the right upper lobe. Pulmonary micronodule within the right upper lobe (4:35). Debris within the right mainstem bronchus and trachea. As well as within the right lower lobe bronchials. Musculoskeletal: Scattered axial and appendicular skeleton lytic lesions. Question pathologic nondisplaced fracture of the right posterior tenth rib (2:49). CT ABDOMEN PELVIS FINDINGS Hepatobiliary: No focal liver abnormality. No gallstones, gallbladder wall thickening, or pericholecystic fluid. No biliary dilatation. Pancreas: No focal lesion. Normal pancreatic contour. No surrounding inflammatory changes. No main pancreatic ductal dilatation. Spleen: Normal in size without focal abnormality. Adrenals/Urinary Tract: No right adrenal nodule. There is a retroperitoneal left upper 3.3 x 2.1 cm lobulated lesion that may be arising from the left adrenal gland. Bilateral kidneys enhance  symmetrically. No hydronephrosis. No hydroureter. The urinary bladder is unremarkable. Stomach/Bowel: Stomach is within normal limits. No evidence of bowel wall thickening or dilatation. Appendix appears normal. Vascular/Lymphatic: No abdominal aorta or iliac aneurysm. Moderate atherosclerotic plaque of the aorta and its branches. No pelvic or inguinal lymphadenopathy. Reproductive: The prostate is enlarged measuring up to 5.1 cm. Other: No intraperitoneal free fluid. No intraperitoneal free gas. No organized fluid collection. Musculoskeletal: Scattered axial and appendicular skeleton lytic lesions. CT LUMBAR SPINE: Segmentation: 5 non-rib-bearing lumbar vertebral bodies. Alignment: Normal. Vertebra: Several lytic lesions with cortical destruction, as an example: Posterior wall and inferior endplate of the L4 level (504:22), right iliac bone (3:97), S1 level (504:22, 3:101). Soft tissues: Question associated soft tissue densities along the lytic lesions with as an example along the right iliac bone (2:91). IMPRESSION: 1. 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 Underlying pulmonary mass difficult to measure/visualize. Associated loculated left pleural effusion. Invasion into the mediastinum with associated narrowing of the left main pulmonary artery as well as left mainstem bronchus. 2. Mediastinal and left hilar lymphadenopathy. 3. Several right lower lobe pulmonary nodules measuring up to 0.9 x 0.5 cm. Findings could represent metastases versus infection/inflammation given debris within the right mainstem bronchus and trachea as well as within the right lower lobe bronchioles with associated right lower lobe mucous plugging. 4. Axial and appendicular skeleton lytic lesions with cortical destruction consistent with metastases. 5. Question pathologic nondisplaced fracture of the right posterior tenth rib. 6. A left retroperitoneal 3.3 x 2.1 cm  lobulated lesion may be arising from the left adrenal gland versus represent a lymph node. Finding likely metastasis. 7. Prostatomegaly. Electronically Signed   By: Iven Finn M.D.   On: 05/18/2021 02:05   CT L-SPINE NO CHARGE  Result Date: 05/18/2021 CLINICAL DATA:  Lower back pain status post fall. Partial collapse of left lung with associated infiltrate and pleural effusion. Question underlying mass on chest x-ray. EXAM: CT CHEST, ABDOMEN, AND PELVIS WITH CONTRAST CT LUMBAR SPINE WITHOUT CONTRAST TECHNIQUE: Multidetector CT imaging of the chest, abdomen and pelvis was performed following the standard protocol during bolus administration of intravenous contrast. Multidetector CT imaging of the lumbar spine was performed without intravenous contrast administration. Multiplanar CT image reconstructions were also generated. CONTRAST:  192mL OMNIPAQUE IOHEXOL 300 MG/ML  SOLN COMPARISON:  Chest x-ray 05/17/2021 FINDINGS: CT CHEST FINDINGS Cardiovascular: Normal heart size. No significant pericardial effusion. The thoracic aorta is normal in caliber. Mild-to-moderate atherosclerotic plaque of the thoracic aorta. At least 3 vessel coronary artery calcifications. The main pulmonary artery is enlarged in caliber measuring up to 3.7 cm. No central or proximal segmental pulmonary embolus. Mediastinum/Nodes: Mediastinal lymphadenopathy with as an example a 1.5  cm precarinal lymph node (2:27). Also noted subcarinal lymph node measuring 1.6 cm (2:33). No definite right hilar lymphadenopathy. Left hilar lymphadenopathy unable to measure. Lungs/Pleura: Almost complete heterogeneous opacification of the left hemithorax with associated pleural thickening and nodularity. Underlying pulmonary mass is difficult to measure. Invasion into the mediastinum is noted. Associated narrowing of the left main pulmonary artery as well as left mainstem bronchus. Associated loculated left pleural effusion. Several right lower lobe  pulmonary nodule measuring up to 0.9 x 0.5 cm (4:121). Calcified pulmonary nodule within the right upper lobe. Pulmonary micronodule within the right upper lobe (4:35). Debris within the right mainstem bronchus and trachea. As well as within the right lower lobe bronchials. Musculoskeletal: Scattered axial and appendicular skeleton lytic lesions. Question pathologic nondisplaced fracture of the right posterior tenth rib (2:49). CT ABDOMEN PELVIS FINDINGS Hepatobiliary: No focal liver abnormality. No gallstones, gallbladder wall thickening, or pericholecystic fluid. No biliary dilatation. Pancreas: No focal lesion. Normal pancreatic contour. No surrounding inflammatory changes. No main pancreatic ductal dilatation. Spleen: Normal in size without focal abnormality. Adrenals/Urinary Tract: No right adrenal nodule. There is a retroperitoneal left upper 3.3 x 2.1 cm lobulated lesion that may be arising from the left adrenal gland. Bilateral kidneys enhance symmetrically. No hydronephrosis. No hydroureter. The urinary bladder is unremarkable. Stomach/Bowel: Stomach is within normal limits. No evidence of bowel wall thickening or dilatation. Appendix appears normal. Vascular/Lymphatic: No abdominal aorta or iliac aneurysm. Moderate atherosclerotic plaque of the aorta and its branches. No pelvic or inguinal lymphadenopathy. Reproductive: The prostate is enlarged measuring up to 5.1 cm. Other: No intraperitoneal free fluid. No intraperitoneal free gas. No organized fluid collection. Musculoskeletal: Scattered axial and appendicular skeleton lytic lesions. CT LUMBAR SPINE: Segmentation: 5 non-rib-bearing lumbar vertebral bodies. Alignment: Normal. Vertebra: Several lytic lesions with cortical destruction, as an example: Posterior wall and inferior endplate of the L4 level (504:22), right iliac bone (3:97), S1 level (504:22, 3:101). Soft tissues: Question associated soft tissue densities along the lytic lesions with as an  example along the right iliac bone (2:91). IMPRESSION: 1. 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 Underlying pulmonary mass difficult to measure/visualize. Associated loculated left pleural effusion. Invasion into the mediastinum with associated narrowing of the left main pulmonary artery as well as left mainstem bronchus. 2. Mediastinal and left hilar lymphadenopathy. 3. Several right lower lobe pulmonary nodules measuring up to 0.9 x 0.5 cm. Findings could represent metastases versus infection/inflammation given debris within the right mainstem bronchus and trachea as well as within the right lower lobe bronchioles with associated right lower lobe mucous plugging. 4. Axial and appendicular skeleton lytic lesions with cortical destruction consistent with metastases. 5. Question pathologic nondisplaced fracture of the right posterior tenth rib. 6. A left retroperitoneal 3.3 x 2.1 cm lobulated lesion may be arising from the left adrenal gland versus represent a lymph node. Finding likely metastasis. 7. Prostatomegaly. Electronically Signed   By: Iven Finn M.D.   On: 05/18/2021 02:05   CT BIOPSY  Result Date: 05/20/2021 INDICATION: 65 year old male with significant left hemithorax malignant process and multifocal lytic osseous lesions concerning for primary bronchogenic carcinoma with bone metastases. He presents for CT-guided biopsy of 1 of the lytic bone lesions. EXAM: CT-guided core biopsy right iliac bone lesion MEDICATIONS: None. ANESTHESIA/SEDATION: Moderate (conscious) sedation was employed during this procedure. A total of Versed 1 mg and Fentanyl 50 mcg was administered intravenously. Moderate Sedation Time: 10 minutes. The patient's level of consciousness and vital signs were  monitored continuously by radiology nursing throughout the procedure under my direct supervision. FLUOROSCOPY TIME:  None. COMPLICATIONS: None immediate.  PROCEDURE: Informed written consent was obtained from the patient after a thorough discussion of the procedural risks, benefits and alternatives. All questions were addressed. Maximal Sterile Barrier Technique was utilized including caps, mask, sterile gowns, sterile gloves, sterile drape, hand hygiene and skin antiseptic. A timeout was performed prior to the initiation of the procedure. A planning CT scan was performed. The lytic lesion in the right posterior iliac bone was identified. The overlying skin was prepped and marked. Local anesthesia was attained by infiltration with 1% lidocaine. A small dermatotomy was made. Under intermittent CT guidance, a 13 gauge trocar needle was advanced through the cortex and positioned at the margin of the lytic component of the mass. Multiple 11 gauge core biopsies were then obtained coaxially using the OnControl biopsy device. Biopsy specimens were placed in formalin and delivered to pathology for further analysis. IMPRESSION: Technically successful CT-guided core biopsy of right lytic iliac bone lesion. Electronically Signed   By: Jacqulynn Cadet M.D.   On: 05/20/2021 12:32   DG CHEST PORT 1 VIEW  Result Date: 05/21/2021 CLINICAL DATA:  Fall.  Back pain.  Shortness of breath. EXAM: PORTABLE CHEST 1 VIEW COMPARISON:  05/20/2021.  CT 05/18/2021. FINDINGS: Complete opacification of the left hemithorax with volume loss again noted. No interim change. Right lung pulmonary nodules best identified by prior CT. No pneumothorax. Heart size cannot be assessed. Lytic lesions of the left sixth and seventh ribs again noted. This is again consistent metastatic disease. IMPRESSION: 1. Complete opacification of the left hemithorax with volume loss again noted. No interim change. Right pulmonary nodules best identified by prior CT. Chest is unchanged from prior exam. 2. Lytic lesions of the left sixth and seventh ribs again noted. This is again consistent with metastatic disease.  Electronically Signed   By: Marcello Moores  Register   On: 05/21/2021 06:01   DG CHEST PORT 1 VIEW  Result Date: 05/20/2021 CLINICAL DATA:  Shortness of breath. EXAM: PORTABLE CHEST 1 VIEW COMPARISON:  May 17, 2021.  May 18, 2021. FINDINGS: There remains complete consolidation of the left hemithorax consistent with probable mass and effusion as noted on prior CT exam. Right lung is unremarkable. No definite pneumothorax is noted. Lytic lesions are seen involving the lateral portions of the left sixth and seventh ribs consistent with metastatic disease. IMPRESSION: Stable complete consolidation of the left hemithorax as described above. Lytic lesions are noted in the left sixth and seventh ribs consistent with metastatic disease. Electronically Signed   By: Marijo Conception M.D.   On: 05/20/2021 08:45    Assessment and Plan:    This is a very pleasant 65 year old male patient, heavy smoker at baseline who presented with chief complaint of lower back pain and loss of appetite and loss of weight.  He was found to have imaging suggestive of left hemithorax malignancy with almost complete opacification and associated pleural thickening/nodularity.  Underlying pulmonary mass difficult to measure/visualize.  Bronchoscopy was attempted by Dr. Ebony Hail, unable to pass endobronchial ultrasound between vocal cords despite multiple attempts due to patient's anatomy cough and signs of bronchoscope.  Decision made to abort procedure for safety.  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  Given clinical presentation, this is consistent with lung primary.  CT abdomen showed retroperitoneal (3.3 x 2.1 cm lobulated lesion that may be arising from the left adrenal  gland as well as scattered axial and appendicular skeleton lytic lesions.  MRI brain showed no evidence of intracranial metastatic disease but concern for metastatic disease to the bones at the tip of the clivus and affecting  the dens.  We have discussed about the diagnosis today with the patient.  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  I will recommend outpatient PET/CT to complete staging.  We also will have to proceed with molecular testing and PD-L1 testing outpatient.  Once we have molecular testing and PD-L1 testing results, we will proceed with treatment based on the results.  If he is a high PD-L1 expresser, he could qualify for immunotherapy alone.  If no targetable mutations or high PD-L1 expression, we may have to proceed with chemoimmunotherapy.  I have discussed about chemotherapy and immunotherapy briefly with the patient today but we will go over this again once we have final molecular results.  Her nurse navigator Beverlee Nims has been working to coordinate the molecular testing.  Outpatient PET/CT has been ordered.  If he deteriorates and needs urgent treatment for his lung cancer, we will have to engage radiation oncology for adjuvant radiation.  He appeared to be quite comfortable during my appointment today without any shortness of breath or the need for oxygen.  Have discussed this plan with the primary team.  Thank you for this referral.   I spent 70 minutes in the care of this patient including history, review of records, discussion of the plan, counseling and coordination of care.

## 2021-05-22 NOTE — Anesthesia Postprocedure Evaluation (Signed)
Anesthesia Post Note  Patient: [employer] farm manager  Procedure(s) Performed: ABORTED CASE     Patient location during evaluation: PACU Anesthesia Type: MAC Level of consciousness: awake and alert Pain management: pain level controlled Vital Signs Assessment: post-procedure vital signs reviewed and stable Respiratory status: spontaneous breathing, nonlabored ventilation, respiratory function stable and patient connected to nasal cannula oxygen Cardiovascular status: stable and blood pressure returned to baseline Postop Assessment: no apparent nausea or vomiting Anesthetic complications: no Comments: Unable to complete procedure secondary to upper airway edema. Not a good candidate for GA   No complications documented.  Last Vitals:  Vitals:   05/21/21 2041 05/22/21 0449  BP: 114/76 (!) 141/82  Pulse: 74 76  Resp: 18 18  Temp: 36.8 C 36.9 C  SpO2: 92% 96%    Last Pain:  Vitals:   05/22/21 0454  TempSrc:   PainSc: Asleep                 Corban Kistler S

## 2021-05-23 LAB — BASIC METABOLIC PANEL
Anion gap: 9 (ref 5–15)
BUN: 10 mg/dL (ref 8–23)
CO2: 27 mmol/L (ref 22–32)
Calcium: 8.1 mg/dL — ABNORMAL LOW (ref 8.9–10.3)
Chloride: 100 mmol/L (ref 98–111)
Creatinine, Ser: 0.54 mg/dL — ABNORMAL LOW (ref 0.61–1.24)
GFR, Estimated: >60 mL/min (ref >60)
Glucose, Bld: 100 mg/dL — ABNORMAL HIGH (ref 70–99)
Potassium: 3.5 mmol/L (ref 3.5–5.1)
Sodium: 136 mmol/L (ref 135–145)

## 2021-05-23 LAB — MAGNESIUM: Magnesium: 1.5 mg/dL — ABNORMAL LOW (ref 1.7–2.4)

## 2021-05-23 MED ORDER — ACETAMINOPHEN 325 MG PO TABS: 650.0000 mg | ORAL_TABLET | Freq: Four times a day (QID) | ORAL | Status: DC | PRN

## 2021-05-23 MED ORDER — GUAIFENESIN-DM 100-10 MG/5ML PO SYRP: 5.0000 mL | ORAL_SOLUTION | ORAL | 0 refills | Status: AC | PRN

## 2021-05-23 MED ORDER — HYDROCODONE-ACETAMINOPHEN 5-325 MG PO TABS: 1.0000 | ORAL_TABLET | Freq: Four times a day (QID) | ORAL | 0 refills | Status: DC | PRN

## 2021-05-23 MED ORDER — POTASSIUM CHLORIDE CRYS ER 20 MEQ PO TBCR
40.0000 meq | EXTENDED_RELEASE_TABLET | Freq: Once | ORAL | Status: AC
  Administered 2021-05-23: 40 meq via ORAL
  Filled 2021-05-23: qty 2

## 2021-05-23 MED ORDER — SENNOSIDES-DOCUSATE SODIUM 8.6-50 MG PO TABS: 1.0000 | ORAL_TABLET | Freq: Every evening | ORAL | 0 refills | Status: AC | PRN

## 2021-05-23 MED ORDER — MAGNESIUM OXIDE -MG SUPPLEMENT 400 (240 MG) MG PO TABS: 400.0000 mg | ORAL_TABLET | Freq: Two times a day (BID) | ORAL | 0 refills | Status: AC

## 2021-05-23 MED ORDER — MAGNESIUM OXIDE -MG SUPPLEMENT 400 (240 MG) MG PO TABS
400.0000 mg | ORAL_TABLET | Freq: Two times a day (BID) | ORAL | Status: DC
  Administered 2021-05-23: 400 mg via ORAL
  Filled 2021-05-23: qty 1

## 2021-05-23 MED ORDER — MAGNESIUM SULFATE 2 GM/50ML IV SOLN
2.0000 g | Freq: Once | INTRAVENOUS | Status: AC
  Administered 2021-05-23: 2 g via INTRAVENOUS
  Filled 2021-05-23: qty 50

## 2021-05-23 MED ORDER — GUAIFENESIN ER 600 MG PO TB12: 600.0000 mg | ORAL_TABLET | Freq: Two times a day (BID) | ORAL | 1 refills | Status: AC

## 2021-05-23 NOTE — Progress Notes (Signed)
Provided and discussed discharge instructions, removed IV intact. Addressed all questions and concerns.  Jerene Pitch

## 2021-05-23 NOTE — Discharge Summary (Signed)
[facility]  Physician Discharge Summary   Patient ID: Bobby Pope MRN: [id number] DOB/AGE: 16-May-1956 65 y.o.  Admit date: 05/17/2021 Discharge date: 05/23/2021    PCP: Pcp, No  DISCHARGE DIAGNOSES:  Metastatic squamous cell lung cancer Normocytic anemia Hypercalcemia malignancy Severe protein calorie malnutrition  RECOMMENDATIONS FOR OUTPATIENT FOLLOW UP: Patient to follow-up with medical oncology    Home Health: None Equipment/Devices: None  CODE STATUS: Full code  DISCHARGE CONDITION: fair  Diet recommendation: Regular  INITIAL HISTORY: The patient is a 65 year old thin chronically ill-appearing AAM who is a current smoker and denies any other medical history who presented to the emergency room for evaluation of worsening back pain.  His sister also raise concern for unintentional weight loss for the last few months and patient does not know how much she lost with thinks is over 30 pounds. 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   Consultants:  [facility] Interventional Radiology Medical Oncology   Procedures: CT-guided core biopsy of right iliac bone lesion    HOSPITAL COURSE:   Metastatic Squamous Cell Lung Cancer -Found to have left lung mass with loculated effusion, invasion into mediastinum, lytic bone lesions, RLL nodules, and retroperitoneal lesion  -Not a surgical candidate based on discussions with cardiothoracic surgery. -Discussed with oncology.  Seen by interventional radiology underwent biopsy of bony lesion in the right iliac.   -MRI brain did not show any definite intracranial metastatic disease.    -Pulmonology was also consulted.  They attempted and bronchoscopy for biopsy however could not pass the scope beyond the vocal cord area.  So the procedure had to be aborted.   -Thoracentesis was considered however could not be adequately done due to lack of appropriate fluid. Pathology from iliac bone biopsy is positive for metastatic squamous cell cancer. Is ordered further testing including molecular studies.  They will follow in the outpatient setting.   Acute Respiratory Failure with Hypoxia Secondary to above.  Now resolved.  Does not have any oxygen requirements currently.   Normocytic Anemia Hemoglobin low but stable.  No overt loss noted.   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  Calcium level improved to 8.9.   Hypomagnesemia/hypophosphatemia    Severe protein calorie malnutrition Nutrition Problem: Severe Malnutrition Etiology: acute illness (likely new dx of cancer) Signs/Symptoms: severe fat depletion,severe muscle depletion Interventions: Ensure Enlive (each supplement provides 350kcal and 20 grams of protein)     Patient is stable.  Discussed with the sisters.  Okay for discharge today.  PERTINENT LABS:  The results of significant diagnostics from this hospitalization (including imaging, microbiology, ancillary and laboratory) are listed below for reference.    Microbiology: Recent Results (from the past 240 hour(s))  Resp Panel by RT-PCR (Flu A&B, Covid) Nasopharyngeal Swab     Status: None   Collection Time: 05/18/21 12:18 AM   Specimen: Nasopharyngeal Swab; Nasopharyngeal(NP) swabs in vial transport medium  Result Value Ref Range Status   SARS Coronavirus 2 by RT PCR NEGATIVE NEGATIVE Final    Comment: (NOTE) SARS-CoV-2 target nucleic acids are NOT DETECTED.  The SARS-CoV-2 RNA is generally detectable in upper respiratory specimens during the acute phase of  infection. The  lowest concentration of SARS-CoV-2 viral copies this assay can detect is 138 copies/mL. A negative result does not preclude SARS-Cov-2 infection and should not be used as the sole basis for treatment or other patient management decisions. A negative result may occur with  improper specimen collection/handling, submission of specimen other than nasopharyngeal swab, presence of viral mutation(s) within the areas targeted by this assay, and inadequate number of viral copies(<138 copies/mL). A negative result must be combined with clinical observations, patient history, and epidemiological information. The expected result is Negative.  Fact Sheet for Patients:  EntrepreneurPulse.com.au  Fact Sheet for Healthcare Providers:  IncredibleEmployment.be  This test is no t yet approved or cleared by the [place] FDA and  has been authorized for detection and/or diagnosis of SARS-CoV-2 by FDA under an Emergency Use Authorization (EUA). This EUA will remain  in effect (meaning this test can be used) for the duration of the COVID-19 declaration under Section 564(b)(1) of the Act, 21 U.S.C.section 360bbb-3(b)(1), unless the authorization is terminated  or revoked sooner.       Influenza A by PCR NEGATIVE NEGATIVE Final   Influenza B by PCR NEGATIVE NEGATIVE Final    Comment: (NOTE) The Xpert Xpress SARS-CoV-2/FLU/RSV plus assay is intended as an aid in the diagnosis of influenza from Nasopharyngeal swab specimens and should not be used as a sole basis for treatment. Nasal washings and aspirates are unacceptable for Xpert Xpress SARS-CoV-2/FLU/RSV testing.  Fact Sheet for Patients: EntrepreneurPulse.com.au  Fact Sheet for Healthcare Providers: IncredibleEmployment.be  This test is not yet approved or cleared by the [place] FDA and has been authorized for detection and/or diagnosis of SARS-CoV-2 by FDA under  an Emergency Use Authorization (EUA). This EUA will remain in effect (meaning this test can be used) for the duration of the COVID-19 declaration under Section 564(b)(1) of the Act, 21 U.S.C. section 360bbb-3(b)(1), unless the authorization is terminated or revoked.  Performed at [facility], [place] [address], [place], [place] [address]      Labs:  COVID-19 Labs   Lab Results  Component Value Date   [place] NEGATIVE 05/18/2021      Basic Metabolic Panel: Recent Labs  Lab 05/18/21 0850 05/19/21 0243 05/20/21 0335 05/21/21 0349 05/22/21 0403 05/23/21 0348  NA 138 138 139 136 137 136  K 4.6 4.2 3.8 3.7 3.7 3.5  CL 97* 100 103 101 99 100  CO2 28 28 27 26 28 27   GLUCOSE 109* 90 84 84 93 100*  BUN 33* 30* 21 17 14 10   CREATININE 0.83 0.82 0.56* 0.64 0.72 0.54*  CALCIUM 13.3* 11.5* 9.9 9.0 8.9 8.1*  MG 1.5* 1.8 1.9 1.7 1.6* 1.5*  PHOS 3.1 1.5* 1.7* 1.9* 1.8*  --    Liver Function Tests: Recent Labs  Lab 05/18/21 0850 05/19/21 0243 05/20/21 0335 05/21/21 0349 05/22/21 0403  AST 19 16 12* 13* 16  ALT 11 10 10 9 9   ALKPHOS 81 77 65 63 72  BILITOT 0.9 0.6 0.8 0.4 0.4  PROT 7.1 6.8 6.1* 5.8* 6.1*  ALBUMIN 3.4* 3.1* 2.9* 2.6* 2.6*    CBC: Recent Labs  Lab 05/18/21 0850 05/19/21 0243 05/20/21 0335 05/21/21 0349 05/22/21 0403  WBC 9.3 7.1 7.2 5.4 5.8  NEUTROABS 7.8* 5.7 5.8 4.1 4.4  HGB 13.7 13.2 12.0* 11.5* 12.1*  HCT 42.5 42.0 38.4* 36.9* 38.5*  MCV 91.6 93.3 93.9 94.1 91.7  PLT 335 332 274 271 297  IMAGING STUDIES DG Chest 2 View  Result Date: 05/17/2021 CLINICAL DATA:  Lower back pain, status post fall. EXAM: CHEST - 2 VIEW COMPARISON:  None. FINDINGS: There is complete opacification of the left hemithorax with a very small amount of aerated lung noted within the left lung base. The right lung is mildly hyperinflated and otherwise clear. Right to left shift of the cardiac silhouette is noted which is subsequently limited in  evaluation. There is marked severity calcification of the thoracic aorta. Ill-defined deformities of the third, sixth and seventh left ribs are seen. IMPRESSION: Findings likely consistent with partial collapse of the left lung with associated atelectasis, infiltrate and pleural effusion. Correlation with chest CT is recommended, as an underlying mass cannot be excluded. Electronically Signed   By: Virgina Norfolk M.D.   On: 05/17/2021 23:28   DG Lumbar Spine Complete  Result Date: 05/17/2021 CLINICAL DATA:  Status post fall with lower back pain. EXAM: LUMBAR SPINE - COMPLETE 4+ VIEW COMPARISON:  None. FINDINGS: There is no evidence of acute lumbar spine fracture. Alignment is normal. Mild multilevel endplate sclerosis is seen. Intervertebral disc spaces are maintained. Moderate severity calcification of the abdominal aorta is noted. IMPRESSION: Multilevel degenerative changes without an acute lumbar spine fracture or subluxation. Electronically Signed   By: Virgina Norfolk M.D.   On: 05/17/2021 23:33   MR BRAIN W WO CONTRAST  Result Date: 05/18/2021 CLINICAL DATA:  Left chest malignancy.  Staging. EXAM: MRI HEAD WITHOUT AND WITH CONTRAST TECHNIQUE: Multiplanar, multiecho pulse sequences of the brain and surrounding structures were obtained without and with intravenous contrast. CONTRAST:  22mL GADAVIST GADOBUTROL 1 MMOL/ML IV SOLN COMPARISON:  None. FINDINGS: Brain: The brain itself has a normal appearance without evidence atrophy, old or acute small or large vessel infarction, primary or metastatic mass lesion, hemorrhage, hydrocephalus or extra-axial collection. No abnormal contrast enhancement occurs. Vascular: Major vessels at the base of the brain show flow. Skull and upper cervical spine: Question abnormal signal in the tip of the clivus in the dens. Osseous metastatic disease not excluded. Sinuses/Orbits: Opacified maxillary sinuses, more extensively on the right than the left. Orbits negative.  Other: Frontal scalp lipoma. IMPRESSION: Motion degraded study, but without evidence of intracranial metastatic disease. Question abnormal marrow signal at the tip of the clivus and affecting the dens. Metastatic disease to the bones of these levels not excluded. Maxillary sinus opacification/inflammatory change, worse on the right than the left. Electronically Signed   By: Nelson Chimes M.D.   On: 05/18/2021 17:19   CT CHEST ABDOMEN PELVIS W CONTRAST  Result Date: 05/18/2021 CLINICAL DATA:  Lower back pain status post fall. Partial collapse of left lung with associated infiltrate and pleural effusion. Question underlying mass on chest x-ray. EXAM: CT CHEST, ABDOMEN, AND PELVIS WITH CONTRAST CT LUMBAR SPINE WITHOUT CONTRAST TECHNIQUE: Multidetector CT imaging of the chest, abdomen and pelvis was performed following the standard protocol during bolus administration of intravenous contrast. Multidetector CT imaging of the lumbar spine was performed without intravenous contrast administration. Multiplanar CT image reconstructions were also generated. CONTRAST:  128mL OMNIPAQUE IOHEXOL 300 MG/ML  SOLN COMPARISON:  Chest x-ray 05/17/2021 FINDINGS: CT CHEST FINDINGS Cardiovascular: Normal heart size. No significant pericardial effusion. The thoracic aorta is normal in caliber. Mild-to-moderate atherosclerotic plaque of the thoracic aorta. At least 3 vessel coronary artery calcifications. The main pulmonary artery is enlarged in caliber measuring up to 3.7 cm. No central or proximal segmental pulmonary embolus. Mediastinum/Nodes: Mediastinal lymphadenopathy with as an  example a 1.5 cm precarinal lymph node (2:27). Also noted subcarinal lymph node measuring 1.6 cm (2:33). No definite right hilar lymphadenopathy. Left hilar lymphadenopathy unable to measure. Lungs/Pleura: Almost complete heterogeneous opacification of the left hemithorax with associated pleural thickening and nodularity. Underlying pulmonary mass is  difficult to measure. Invasion into the mediastinum is noted. Associated narrowing of the left main pulmonary artery as well as left mainstem bronchus. Associated loculated left pleural effusion. Several right lower lobe pulmonary nodule measuring up to 0.9 x 0.5 cm (4:121). Calcified pulmonary nodule within the right upper lobe. Pulmonary micronodule within the right upper lobe (4:35). Debris within the right mainstem bronchus and trachea. As well as within the right lower lobe bronchials. Musculoskeletal: Scattered axial and appendicular skeleton lytic lesions. Question pathologic nondisplaced fracture of the right posterior tenth rib (2:49). CT ABDOMEN PELVIS FINDINGS Hepatobiliary: No focal liver abnormality. No gallstones, gallbladder wall thickening, or pericholecystic fluid. No biliary dilatation. Pancreas: No focal lesion. Normal pancreatic contour. No surrounding inflammatory changes. No main pancreatic ductal dilatation. Spleen: Normal in size without focal abnormality. Adrenals/Urinary Tract: No right adrenal nodule. There is a retroperitoneal left upper 3.3 x 2.1 cm lobulated lesion that may be arising from the left adrenal gland. Bilateral kidneys enhance symmetrically. No hydronephrosis. No hydroureter. The urinary bladder is unremarkable. Stomach/Bowel: Stomach is within normal limits. No evidence of bowel wall thickening or dilatation. Appendix appears normal. Vascular/Lymphatic: No abdominal aorta or iliac aneurysm. Moderate atherosclerotic plaque of the aorta and its branches. No pelvic or inguinal lymphadenopathy. Reproductive: The prostate is enlarged measuring up to 5.1 cm. Other: No intraperitoneal free fluid. No intraperitoneal free gas. No organized fluid collection. Musculoskeletal: Scattered axial and appendicular skeleton lytic lesions. CT LUMBAR SPINE: Segmentation: 5 non-rib-bearing lumbar vertebral bodies. Alignment: Normal. Vertebra: Several lytic lesions with cortical destruction, as  an example: Posterior wall and inferior endplate of the L4 level (504:22), right iliac bone (3:97), S1 level (504:22, 3:101). Soft tissues: Question associated soft tissue densities along the lytic lesions with as an example along the right iliac bone (2:91). IMPRESSION: 1. 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 Underlying pulmonary mass difficult to measure/visualize. Associated loculated left pleural effusion. Invasion into the mediastinum with associated narrowing of the left main pulmonary artery as well as left mainstem bronchus. 2. Mediastinal and left hilar lymphadenopathy. 3. Several right lower lobe pulmonary nodules measuring up to 0.9 x 0.5 cm. Findings could represent metastases versus infection/inflammation given debris within the right mainstem bronchus and trachea as well as within the right lower lobe bronchioles with associated right lower lobe mucous plugging. 4. Axial and appendicular skeleton lytic lesions with cortical destruction consistent with metastases. 5. Question pathologic nondisplaced fracture of the right posterior tenth rib. 6. A left retroperitoneal 3.3 x 2.1 cm lobulated lesion may be arising from the left adrenal gland versus represent a lymph node. Finding likely metastasis. 7. Prostatomegaly. Electronically Signed   By: Iven Finn M.D.   On: 05/18/2021 02:05   CT L-SPINE NO CHARGE  Result Date: 05/18/2021 CLINICAL DATA:  Lower back pain status post fall. Partial collapse of left lung with associated infiltrate and pleural effusion. Question underlying mass on chest x-ray. EXAM: CT CHEST, ABDOMEN, AND PELVIS WITH CONTRAST CT LUMBAR SPINE WITHOUT CONTRAST TECHNIQUE: Multidetector CT imaging of the chest, abdomen and pelvis was performed following the standard protocol during bolus administration of intravenous contrast. Multidetector CT imaging of the lumbar spine was performed without intravenous contrast  administration.  Multiplanar CT image reconstructions were also generated. CONTRAST:  157mL OMNIPAQUE IOHEXOL 300 MG/ML  SOLN COMPARISON:  Chest x-ray 05/17/2021 FINDINGS: CT CHEST FINDINGS Cardiovascular: Normal heart size. No significant pericardial effusion. The thoracic aorta is normal in caliber. Mild-to-moderate atherosclerotic plaque of the thoracic aorta. At least 3 vessel coronary artery calcifications. The main pulmonary artery is enlarged in caliber measuring up to 3.7 cm. No central or proximal segmental pulmonary embolus. Mediastinum/Nodes: Mediastinal lymphadenopathy with as an example a 1.5 cm precarinal lymph node (2:27). Also noted subcarinal lymph node measuring 1.6 cm (2:33). No definite right hilar lymphadenopathy. Left hilar lymphadenopathy unable to measure. Lungs/Pleura: Almost complete heterogeneous opacification of the left hemithorax with associated pleural thickening and nodularity. Underlying pulmonary mass is difficult to measure. Invasion into the mediastinum is noted. Associated narrowing of the left main pulmonary artery as well as left mainstem bronchus. Associated loculated left pleural effusion. Several right lower lobe pulmonary nodule measuring up to 0.9 x 0.5 cm (4:121). Calcified pulmonary nodule within the right upper lobe. Pulmonary micronodule within the right upper lobe (4:35). Debris within the right mainstem bronchus and trachea. As well as within the right lower lobe bronchials. Musculoskeletal: Scattered axial and appendicular skeleton lytic lesions. Question pathologic nondisplaced fracture of the right posterior tenth rib (2:49). CT ABDOMEN PELVIS FINDINGS Hepatobiliary: No focal liver abnormality. No gallstones, gallbladder wall thickening, or pericholecystic fluid. No biliary dilatation. Pancreas: No focal lesion. Normal pancreatic contour. No surrounding inflammatory changes. No main pancreatic ductal dilatation. Spleen: Normal in size without focal abnormality.  Adrenals/Urinary Tract: No right adrenal nodule. There is a retroperitoneal left upper 3.3 x 2.1 cm lobulated lesion that may be arising from the left adrenal gland. Bilateral kidneys enhance symmetrically. No hydronephrosis. No hydroureter. The urinary bladder is unremarkable. Stomach/Bowel: Stomach is within normal limits. No evidence of bowel wall thickening or dilatation. Appendix appears normal. Vascular/Lymphatic: No abdominal aorta or iliac aneurysm. Moderate atherosclerotic plaque of the aorta and its branches. No pelvic or inguinal lymphadenopathy. Reproductive: The prostate is enlarged measuring up to 5.1 cm. Other: No intraperitoneal free fluid. No intraperitoneal free gas. No organized fluid collection. Musculoskeletal: Scattered axial and appendicular skeleton lytic lesions. CT LUMBAR SPINE: Segmentation: 5 non-rib-bearing lumbar vertebral bodies. Alignment: Normal. Vertebra: Several lytic lesions with cortical destruction, as an example: Posterior wall and inferior endplate of the L4 level (504:22), right iliac bone (3:97), S1 level (504:22, 3:101). Soft tissues: Question associated soft tissue densities along the lytic lesions with as an example along the right iliac bone (2:91). IMPRESSION: 1. 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 Underlying pulmonary mass difficult to measure/visualize. Associated loculated left pleural effusion. Invasion into the mediastinum with associated narrowing of the left main pulmonary artery as well as left mainstem bronchus. 2. Mediastinal and left hilar lymphadenopathy. 3. Several right lower lobe pulmonary nodules measuring up to 0.9 x 0.5 cm. Findings could represent metastases versus infection/inflammation given debris within the right mainstem bronchus and trachea as well as within the right lower lobe bronchioles with associated right lower lobe mucous plugging. 4. Axial and appendicular skeleton  lytic lesions with cortical destruction consistent with metastases. 5. Question pathologic nondisplaced fracture of the right posterior tenth rib. 6. A left retroperitoneal 3.3 x 2.1 cm lobulated lesion may be arising from the left adrenal gland versus represent a lymph node. Finding likely metastasis. 7. Prostatomegaly. Electronically Signed   By: Iven Finn M.D.   On: 05/18/2021 02:05   CT BIOPSY  Result Date: 05/20/2021 INDICATION: 65 year old male with significant left hemithorax malignant process and multifocal lytic osseous lesions concerning for primary bronchogenic carcinoma with bone metastases. He presents for CT-guided biopsy of 1 of the lytic bone lesions. EXAM: CT-guided core biopsy right iliac bone lesion MEDICATIONS: None. ANESTHESIA/SEDATION: Moderate (conscious) sedation was employed during this procedure. A total of Versed 1 mg and Fentanyl 50 mcg was administered intravenously. Moderate Sedation Time: 10 minutes. The patient's level of consciousness and vital signs were monitored continuously by radiology nursing throughout the procedure under my direct supervision. FLUOROSCOPY TIME:  None. COMPLICATIONS: None immediate. PROCEDURE: Informed written consent was obtained from the patient after a thorough discussion of the procedural risks, benefits and alternatives. All questions were addressed. Maximal Sterile Barrier Technique was utilized including caps, mask, sterile gowns, sterile gloves, sterile drape, hand hygiene and skin antiseptic. A timeout was performed prior to the initiation of the procedure. A planning CT scan was performed. The lytic lesion in the right posterior iliac bone was identified. The overlying skin was prepped and marked. Local anesthesia was attained by infiltration with 1% lidocaine. A small dermatotomy was made. Under intermittent CT guidance, a 13 gauge trocar needle was advanced through the cortex and positioned at the margin of the lytic component of the mass.  Multiple 11 gauge core biopsies were then obtained coaxially using the OnControl biopsy device. Biopsy specimens were placed in formalin and delivered to pathology for further analysis. IMPRESSION: Technically successful CT-guided core biopsy of right lytic iliac bone lesion. Electronically Signed   By: Jacqulynn Cadet M.D.   On: 05/20/2021 12:32   DG CHEST PORT 1 VIEW  Result Date: 05/21/2021 CLINICAL DATA:  Fall.  Back pain.  Shortness of breath. EXAM: PORTABLE CHEST 1 VIEW COMPARISON:  05/20/2021.  CT 05/18/2021. FINDINGS: Complete opacification of the left hemithorax with volume loss again noted. No interim change. Right lung pulmonary nodules best identified by prior CT. No pneumothorax. Heart size cannot be assessed. Lytic lesions of the left sixth and seventh ribs again noted. This is again consistent metastatic disease. IMPRESSION: 1. Complete opacification of the left hemithorax with volume loss again noted. No interim change. Right pulmonary nodules best identified by prior CT. Chest is unchanged from prior exam. 2. Lytic lesions of the left sixth and seventh ribs again noted. This is again consistent with metastatic disease. Electronically Signed   By: Marcello Moores  Register   On: 05/21/2021 06:01   DG CHEST PORT 1 VIEW  Result Date: 05/20/2021 CLINICAL DATA:  Shortness of breath. EXAM: PORTABLE CHEST 1 VIEW COMPARISON:  May 17, 2021.  May 18, 2021. FINDINGS: There remains complete consolidation of the left hemithorax consistent with probable mass and effusion as noted on prior CT exam. Right lung is unremarkable. No definite pneumothorax is noted. Lytic lesions are seen involving the lateral portions of the left sixth and seventh ribs consistent with metastatic disease. IMPRESSION: Stable complete consolidation of the left hemithorax as described above. Lytic lesions are noted in the left sixth and seventh ribs consistent with metastatic disease. Electronically Signed   By: Marijo Conception M.D.   On:  05/20/2021 08:45    DISCHARGE EXAMINATION: Vitals:   05/22/21 2109 05/23/21 0455 05/23/21 0500 05/23/21 1221  BP: 131/82 (!) 143/89  (!) 137/93  Pulse: 97 85  92  Resp: (!) 21 20  20   Temp: 98.7 F (37.1 C) 98 F (36.7 C)  98.3 F (36.8 C)  TempSrc: Oral Oral  Oral  SpO2:  94% 98%  98%  Weight:   67 kg   Height:       General appearance: Awake alert.  In no distress Resp: Coarse breath sounds with diminished air entry at the bases.  Normal effort at rest. Cardio: S1-S2 is normal regular.  No S3-S4.  No rubs murmurs or bruit GI: Abdomen is soft.  Nontender nondistended.  Bowel sounds are present normal.  No masses organomegaly    DISPOSITION: Home  Discharge Instructions     Ambulatory referral to Hematology / Oncology   Complete by: As directed    Newly diagnosed metastatic squamous cell lung cancer.  Seen by Dr. Chryl Heck in the hospital   Call MD for:  difficulty breathing, headache or visual disturbances   Complete by: As directed    Call MD for:  extreme fatigue   Complete by: As directed    Call MD for:  persistant dizziness or light-headedness   Complete by: As directed    Call MD for:  persistant nausea and vomiting   Complete by: As directed    Call MD for:  severe uncontrolled pain   Complete by: As directed    Call MD for:  temperature >100.4   Complete by: As directed    Diet - low sodium heart healthy   Complete by: As directed    Discharge instructions   Complete by: As directed    Please wait to hear from the cancer center for your appointment.  If you do not hear anything in the next week or so please call them. Seek attention if you were to develop high fevers, have significant blood in your sputum, develop worsening shortness of breath.  You were cared for by a hospitalist during your hospital stay. If you have any questions about your discharge medications or the care you received while you were in the hospital after you are discharged, you can call the  unit and asked to speak with the hospitalist on call if the hospitalist that took care of you is not available. Once you are discharged, your primary care physician will handle any further medical issues. Please note that NO REFILLS for any discharge medications will be authorized once you are discharged, as it is imperative that you return to your primary care physician (or establish a relationship with a primary care physician if you do not have one) for your aftercare needs so that they can reassess your need for medications and monitor your lab values. If you do not have a primary care physician, you can call [phone] for a physician referral.   Increase activity slowly   Complete by: As directed    No wound care   Complete by: As directed          Allergies as of 05/23/2021   No Known Allergies      Medication List     TAKE these medications    acetaminophen 325 MG tablet Commonly known as: TYLENOL Take 2 tablets (650 mg total) by mouth every 6 (six) hours as needed for mild pain (or Fever >/= 101).   guaiFENesin 600 MG 12 hr tablet Commonly known as: Mucinex Take 1 tablet (600 mg total) by mouth 2 (two) times daily.   guaiFENesin-dextromethorphan 100-10 MG/5ML syrup Commonly known as: ROBITUSSIN DM Take 5 mLs by mouth every 4 (four) hours as needed for cough.   HYDROcodone-acetaminophen 5-325 MG tablet Commonly known as: NORCO/VICODIN Take 1 tablet by mouth every 6 (six) hours as needed  for moderate pain.   magnesium oxide 400 (240 Mg) MG tablet Commonly known as: MAG-OX Take 1 tablet (400 mg total) by mouth 2 (two) times daily.   senna-docusate 8.6-50 MG tablet Commonly known as: Senokot-S Take 1 tablet by mouth at bedtime as needed for mild constipation.   tiZANidine 4 MG capsule Commonly known as: Zanaflex Take 1 capsule (4 mg total) by mouth 2 (two) times daily as needed for muscle spasms.          Follow-up Information     Benay Pike, MD Follow  up.   Specialty: Hematology and Oncology Why: her office will call with appointment Contact information: [place] [address] [phone]                 TOTAL DISCHARGE TIME: 60 minutes  [place]  [facility] Pager on [email or web address]  05/24/2021, 3:26 PM

## 2021-05-23 NOTE — TOC Transition Note (Addendum)
Transition of Care [facility]) - CM/SW Discharge Note   Patient Details  Name: Bobby Pope MRN: [id number] Date of Birth: Apr 17, 1956  Transition of Care [facility]) CM/SW Contact:  Illene Regulus, LCSW Phone Number: 05/23/2021, 1:51 PM   Clinical Narrative:     CSW spoke with patient and confirmed no insurance. PT recommenced a rolling walker, CSW confirmed patient would like one. CSW contacted [employer] and informed them that the patient has no insurance. Zach at [facility] will talk to patient about financial hardship to get DME that is needed. CSW will sign off.  Final next level of care: Home/Self Care Barriers to Discharge: Barriers Resolved   Patient Goals and CMS Choice Patient states their goals for this hospitalization and ongoing recovery are:: To return home. CMS Medicare.gov Compare Post Acute Care list provided to:: Patient Choice offered to / list presented to : Patient  Discharge Placement  Patient plans to go home.                     Discharge Plan and Services                DME Arranged: Walker rolling DME Agency: [employer] Date DME Agency Contacted: 05/23/21   Representative spoke with at DME Agency: zach blank            Social Determinants of Health ([place]) Interventions     Readmission Risk Interventions No flowsheet data found.

## 2021-05-23 NOTE — Plan of Care (Signed)
  Problem: Health Behavior/Discharge Planning: Goal: Ability to manage health-related needs will improve Outcome: Progressing   Problem: Clinical Measurements: Goal: Respiratory complications will improve Outcome: Progressing   Problem: Nutrition: Goal: Adequate nutrition will be maintained Outcome: Progressing   Problem: Pain Managment: Goal: General experience of comfort will improve Outcome: Progressing

## 2021-05-23 NOTE — Progress Notes (Signed)
Pt ambulated in the hall. Pt's O2 was 94% on room air while sitting. O2 dropped to 90% while walking then increased to 94% once he sat down.  Bobby Pope

## 2021-05-23 NOTE — Evaluation (Signed)
Physical Therapy Evaluation Patient Details Name: Bobby Pope MRN: [id number] DOB: September 20, 1956 Today's Date: 05/23/2021   History of Present Illness  65 year old thin chronically ill-appearing AAM  presented to the emergency room for evaluation of worsening back pain.  His sister also raise concern for unintentional weight loss for the last few months. Pt is a current smoker and denies any other medical history. 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  Clinical Impression  Pt admitted with above diagnosis. Pt ambulated 100' with RW, no loss of balance, distance limited by fatigue. Pt reported back pain with sit to stand but denied pain while ambulating. SaO2 92% on room air walking. Pt currently with functional limitations due to the deficits listed below (see PT Problem List). Pt will benefit from skilled PT to increase their independence and safety with mobility to allow discharge to the venue listed below.       Follow Up Recommendations No PT follow up    Equipment Recommendations  Other (comment) (rollator)    Recommendations for Other Services       Precautions / Restrictions Precautions Precautions: None Precaution Comments: pt denies h/o falls in past 1 year Restrictions Weight Bearing Restrictions: No      Mobility  Bed Mobility Overal bed mobility: Modified Independent             General bed mobility comments: used rail    Transfers Overall transfer level: Modified independent Equipment used: Rolling walker (2 wheeled) Transfers: Sit to/from Stand Sit to Stand: Supervision         General transfer comment: VCs hand placement  Ambulation/Gait Ambulation/Gait assistance: Modified independent (Device/Increase time) Gait Distance (Feet): 100 Feet Assistive device: Rolling walker (2 wheeled) Gait Pattern/deviations: Step-through pattern;Decreased stride length;Trunk flexed Gait velocity: decr    General Gait Details: VCs for posture and positioning with RW; distance limited by fatigue, pt reported his back "feels stiff" when walking, denied pain while walking, distance limited by fatigue, SaO2 92% on room air walking  Stairs            Wheelchair Mobility    Modified Rankin (Stroke Patients Only)       Balance Overall balance assessment: Modified Independent                                           Pertinent Vitals/Pain Pain Assessment: 0-10 Pain Score: 4  Pain Location: low back with sit to stand Pain Descriptors / Indicators: Sore Pain Intervention(s): Limited activity within patient's tolerance;Monitored during session;Repositioned;Premedicated before session    Home Living Family/patient expects to be discharged to:: Private residence Living Arrangements: Children Available Help at Discharge: Family;Available PRN/intermittently   Home Access: Stairs to enter   Entrance Stairs-Number of Steps: 2 Home Layout: One level Home Equipment: Shower seat Additional Comments: lives with son who works, sister is able to assist some    Prior Function Level of Independence: Independent         Comments: pt denies h/o falls in past 1 year     Hand Dominance        Extremity/Trunk Assessment   Upper Extremity Assessment Upper Extremity Assessment: Overall WFL for tasks assessed    Lower Extremity Assessment Lower Extremity Assessment: Overall WFL for tasks assessed    Cervical / Trunk Assessment Cervical / Trunk Assessment: Normal  Communication  Communication: No difficulties  Cognition Arousal/Alertness: Awake/alert Behavior During Therapy: WFL for tasks assessed/performed Overall Cognitive Status: Within Functional Limits for tasks assessed                                        General Comments      Exercises     Assessment/Plan    PT Assessment Patient needs continued PT services  PT Problem List  Decreased activity tolerance;Pain;Decreased knowledge of use of DME       PT Treatment Interventions Gait training;Balance training;Functional mobility training    PT Goals (Current goals can be found in the Care Plan section)  Acute Rehab PT Goals 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 PT Goal Formulation: With patient Time For Goal Achievement: 06/06/21 Potential to Achieve Goals: Good    Frequency Min 3X/week   Barriers to discharge        Co-evaluation               AM-PAC PT "6 Clicks" Mobility  Outcome Measure Help needed turning from your back to your side while in a flat bed without using bedrails?: None Help needed moving from lying on your back to sitting on the side of a flat bed without using bedrails?: None Help needed moving to and from a bed to a chair (including a wheelchair)?: None Help needed standing up from a chair using your arms (e.g., wheelchair or bedside chair)?: None Help needed to walk in hospital room?: None Help needed climbing 3-5 steps with a railing? : None 6 Click Score: 24    End of Session Equipment Utilized During Treatment: Gait belt Activity Tolerance: Patient tolerated treatment well Patient left: in chair;with call bell/phone within reach;with chair alarm set Nurse Communication: Mobility status PT Visit Diagnosis: Difficulty in walking, not elsewhere classified (R26.2)    Time: 3361-2244 PT Time Calculation (min) (ACUTE ONLY): 21 min   Charges:   PT Evaluation $PT Eval Low Complexity: 1 Low        Philomena Doheny PT 05/23/2021  Acute Rehabilitation Services Pager [phone] Office [phone]

## 2021-05-27 ENCOUNTER — Telehealth: Payer: Self-pay | Admitting: Hematology and Oncology

## 2021-05-27 ENCOUNTER — Ambulatory Visit (HOSPITAL_COMMUNITY)
Admission: RE | Admit: 2021-05-27 | Discharge: 2021-05-27 | Disposition: A | Discharge status: Home / Self Care | Payer: Medicaid Other | Source: Ambulatory Visit | Attending: Hematology and Oncology | Admitting: Hematology and Oncology

## 2021-05-27 ENCOUNTER — Other Ambulatory Visit: Payer: Self-pay

## 2021-05-27 DIAGNOSIS — C3490 Malignant neoplasm of unspecified part of unspecified bronchus or lung: Secondary | ICD-10-CM | POA: Diagnosis not present

## 2021-05-27 LAB — GLUCOSE, CAPILLARY: Glucose-Capillary: 76 mg/dL (ref 70–99)

## 2021-05-27 MED ORDER — FLUDEOXYGLUCOSE F - 18 (FDG) INJECTION
7.3000 | Freq: Once | INTRAVENOUS | Status: AC
  Administered 2021-05-27: 8.09 via INTRAVENOUS

## 2021-05-27 NOTE — Telephone Encounter (Signed)
Scheduled appt per 6/9 referral. Pt's sister is aware.

## 2021-05-28 ENCOUNTER — Other Ambulatory Visit: Payer: Self-pay | Admitting: Hematology and Oncology

## 2021-05-28 ENCOUNTER — Telehealth: Payer: Self-pay

## 2021-05-28 DIAGNOSIS — C3492 Malignant neoplasm of unspecified part of left bronchus or lung: Secondary | ICD-10-CM

## 2021-05-28 NOTE — Progress Notes (Signed)
I  called sister per patient's preference, and discussed need for rad onc consultation ASAP Discussed with Dr Sondra Come as well. She expressed understanding She also was wondering how he can get oxygen in the mean time. I will try to see if we can get this for him before he comes for FU.  Angline Schweigert

## 2021-05-28 NOTE — Telephone Encounter (Signed)
[facility] called to report on PET results. Results confirmed in Epic, read back, and sent to Dr Iruku's attention.

## 2021-05-28 NOTE — Progress Notes (Signed)
Given PET CT findings, concern for marked narrowing of left mainstem bronchus, discussed with Dr Kinard if he would be a candidate for radiation while we are awaiting PDL1 and foundation results. He agrees that he will benefit from short course of palliative radiation. Urgent referral sent to rad onc based on his recommendations. They understand the need to see him ASAP.  Bobby Pope   

## 2021-05-29 ENCOUNTER — Encounter: Payer: Self-pay | Admitting: *Deleted

## 2021-05-29 NOTE — Progress Notes (Signed)
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   Patient presented to the ER with complaints of lower back pain and weight loss.  His back pain has worsened over the past month and a half.  He has lost about 40 pounds in the past couple of months.  MRI Brain 05/18/2021: Motion degraded study, but without evidence of intracranial metastatic disease.  PET 05/27/2021: Examination shows extensive FDG avid tumor involving nearly the entire left hemithorax. Tumor invades the left hilum and left mediastinum with marked narrowing of the left mainstem bronchus.  Tumor also appears to extend through the left hemidiaphragm into the left lower quadrant of the abdomen.  FDG avid bilateral mediastinal, subcarinal, and upper abdominal nodal metastasis.  Widespread lytic bone metastases involving the axial and appendicular skeleton. Bone lesions are too numerous to count.  FDG avid lytic lesion involving the pedicle of T5 is noted with central canal extension. This may be of neuro surgical significance.  Biopsies of Right Iliac Bone 05/20/2021    Tobacco/Marijuana/Snuff/ETOH use: Former smoker, recently quit.  Past/Anticipated interventions by cardiothoracic surgery, if any:   Past/Anticipated interventions by medical oncology, if any:  Dr. Chryl Heck 05/22/2021 - He was found to have imaging suggestive of left hemithorax malignancy with almost complete opacification and associated pleural thickening/nodularity.  -Underlying pulmonary mass difficult to measure/visualize.  Bronchoscopy was attempted by Dr. Ebony Hail, unable to pass endobronchial ultrasound between vocal cords despite multiple attempts due to patient's anatomy cough and signs of bronchoscope.  Decision made to abort procedure for safety. 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  Given clinical  presentation, this is consistent with lung primary. -We have discussed about the diagnosis today with the patient.  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 -Once we have molecular testing and PD-L1 testing results, we will proceed with treatment based on the results.  If he is a high PD-L1 expresser, he could qualify for immunotherapy alone.  If no targetable mutations or high PD-L1 expression, we may have to proceed with chemoimmunotherapy. - If he deteriorates and needs urgent treatment for his lung cancer, we will have to engage radiation oncology for adjuvant radiation.  Signs/Symptoms Weight changes, if any: 40 pounds lost in the last couple of months Respiratory complaints, if any: He reports his breathing is ok.  Sister notes he has some wheezing when up walking.  He spoke with Dr. Chryl Heck about getting some oxygen. Hemoptysis, if any: Has productive cough with green phlegm.  Pain issues, if any:  He reports some pain in his mid back, worse with certain positions.  SAFETY ISSUES: Prior radiation? No Pacemaker/ICD? No Possible current pregnancy? N/a Is the patient on methotrexate? No  Current Complaints / other details:

## 2021-05-29 NOTE — Progress Notes (Signed)
I followed up on Mr. Bobby Pope molecular test results.  Per [facility] patient portal, estimate results on 6/21.  Patient is set up to see Dr. Chryl Heck on 6/22 and I updated her on the information.

## 2021-05-30 ENCOUNTER — Other Ambulatory Visit: Payer: Self-pay

## 2021-05-30 ENCOUNTER — Ambulatory Visit
Admission: RE | Admit: 2021-05-30 | Discharge: 2021-05-30 | Disposition: A | Discharge status: Home / Self Care | Payer: Medicaid Other | Source: Ambulatory Visit | Attending: Radiation Oncology | Admitting: Radiation Oncology

## 2021-05-30 ENCOUNTER — Encounter (HOSPITAL_COMMUNITY): Payer: Self-pay | Admitting: Hematology and Oncology

## 2021-05-30 ENCOUNTER — Encounter: Payer: Self-pay | Admitting: Radiation Oncology

## 2021-05-30 ENCOUNTER — Encounter (HOSPITAL_COMMUNITY): Payer: Self-pay

## 2021-05-30 VITALS — BP 138/81 | HR 101 | Temp 97.9°F | Resp 20 | Ht 69.0 in | Wt 136.6 lb

## 2021-05-30 DIAGNOSIS — M545 Low back pain, unspecified: Secondary | ICD-10-CM | POA: Insufficient documentation

## 2021-05-30 DIAGNOSIS — W19XXXA Unspecified fall, initial encounter: Secondary | ICD-10-CM | POA: Diagnosis not present

## 2021-05-30 DIAGNOSIS — Z9221 Personal history of antineoplastic chemotherapy: Secondary | ICD-10-CM | POA: Insufficient documentation

## 2021-05-30 DIAGNOSIS — F17210 Nicotine dependence, cigarettes, uncomplicated: Secondary | ICD-10-CM | POA: Insufficient documentation

## 2021-05-30 DIAGNOSIS — I2510 Atherosclerotic heart disease of native coronary artery without angina pectoris: Secondary | ICD-10-CM | POA: Insufficient documentation

## 2021-05-30 DIAGNOSIS — C7951 Secondary malignant neoplasm of bone: Secondary | ICD-10-CM | POA: Insufficient documentation

## 2021-05-30 DIAGNOSIS — R590 Localized enlarged lymph nodes: Secondary | ICD-10-CM | POA: Insufficient documentation

## 2021-05-30 DIAGNOSIS — J90 Pleural effusion, not elsewhere classified: Secondary | ICD-10-CM | POA: Diagnosis not present

## 2021-05-30 DIAGNOSIS — C3492 Malignant neoplasm of unspecified part of left bronchus or lung: Secondary | ICD-10-CM

## 2021-05-30 DIAGNOSIS — C3482 Malignant neoplasm of overlapping sites of left bronchus and lung: Secondary | ICD-10-CM | POA: Insufficient documentation

## 2021-05-30 DIAGNOSIS — D170 Benign lipomatous neoplasm of skin and subcutaneous tissue of head, face and neck: Secondary | ICD-10-CM | POA: Diagnosis not present

## 2021-05-30 DIAGNOSIS — R634 Abnormal weight loss: Secondary | ICD-10-CM | POA: Insufficient documentation

## 2021-05-30 DIAGNOSIS — Z79899 Other long term (current) drug therapy: Secondary | ICD-10-CM | POA: Insufficient documentation

## 2021-05-30 NOTE — Progress Notes (Signed)
Radiation Oncology         [phone] ________________________________  Name: Bobby Pope        MRN: [id number]  Date of Service: 05/30/2021 DOB: 1956-02-24  CC:Pcp, No  Benay Pike, MD     REFERRING PHYSICIAN: Benay Pike, MD   DIAGNOSIS: The encounter diagnosis was Malignant neoplasm of left lung, unspecified part of lung ([place]).   HISTORY OF PRESENT ILLNESS: Bobby Pope is a 65 y.o. male seen at the request of Dr. Chryl Heck for a newly diagnosed Stage IV lung cancer. The paitent presented with low back pain and weight loss of greater than 40 pounds and progressive pain in the back. He had a chest x-ray that showed partial collapse of the left lung with associated atelectasis infiltrate and pleural effusion, CT scan of the chest abdomen pelvis on 05/18/2021 in the hospital setting revealed near complete opacification of the left lung with an underlying pulmonary mass difficult to evaluate.  There was concern for invasion into the mediastinum and associated narrowing of the left main pulmonary artery and left mainstem bronchus.  Mediastinal and left hilar adenopathy was identified.  Several right lower lobe nodules measuring up to 9 mm were identified concerning for possible metastases.  There were lytic lesions throughout the axial and appendicular skeleton and a pathologic nondisplaced fracture of the right posterior 10th rib and left retroperitoneal soft tissue lesion either the left adrenal gland versus lymphadenopathy measuring up to 3.3 cm.  He had prostamegaly as well.  An MRI of the brain was also performed and negative for intracranial disease but limited by motion.  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  Additional molecular testing has been ordered but is pending.  He is now out of the hospital and PET scan on 05/27/2021 showed an FDG avid tumor  involving the entire left hemithorax invading the hilum and left mediastinum with marked narrowing of the left mainstem bronchus also with tumor extension through the left hemidiaphragm into the left lower quadrant of the abdomen, bilateral mediastinal subcarinal and upper abdominal nodal metastases as well as widespread lytic lesions of the bones were seen, lesions were too numerous to count, a lytic lesion involving involving the pedicle of T5 was noted with central canal extension, he is seen today to discuss palliative radiotherapy.    PREVIOUS RADIATION THERAPY: No   PAST MEDICAL HISTORY: History reviewed. No pertinent past medical history.     PAST SURGICAL HISTORY:History reviewed. No pertinent surgical history.   FAMILY HISTORY:  Family History  Problem Relation Age of Onset   Colon cancer Mother      SOCIAL HISTORY:  reports that he has quit smoking. His smoking use included cigarettes. He smoked an average of 0.25 packs per day. He has never used smokeless tobacco. He reports current alcohol use. The patient is single and lives in [place]. He lives with his son. His sister helps with cooking and with his transportation. He coaches youth baseball.    ALLERGIES: Patient has no known allergies.   MEDICATIONS:  Current Outpatient Medications  Medication Sig Dispense Refill   acetaminophen (TYLENOL) 325 MG tablet Take 2 tablets (650 mg total) by mouth every 6 (six) hours as needed for mild pain (or Fever >/= 101).     guaiFENesin (MUCINEX) 600 MG 12 hr tablet Take 1 tablet (600 mg total) by mouth 2 (two) times  daily. 60 tablet 1   guaiFENesin-dextromethorphan (ROBITUSSIN DM) 100-10 MG/5ML syrup Take 5 mLs by mouth every 4 (four) hours as needed for cough. 118 mL 0   HYDROcodone-acetaminophen (NORCO/VICODIN) 5-325 MG tablet Take 1 tablet by mouth every 6 (six) hours as needed for moderate pain. 30 tablet 0   magnesium oxide (MAG-OX) 400 (240 Mg) MG tablet Take 1 tablet (400 mg  total) by mouth 2 (two) times daily. 60 tablet 0   senna-docusate (SENOKOT-S) 8.6-50 MG tablet Take 1 tablet by mouth at bedtime as needed for mild constipation. 30 tablet 0   tiZANidine (ZANAFLEX) 4 MG capsule Take 1 capsule (4 mg total) by mouth 2 (two) times daily as needed for muscle spasms. 14 capsule 0   No current facility-administered medications for this encounter.     REVIEW OF SYSTEMS: On review of systems, the patient reports that he is having a hard time with productive cough with whitish sometimes green sputum. No hemoptysis is noted. He denies any known covid exposures. He reports continued weight loss of greater than 40 pounds over last last 3-5 months. He denies dysphagia but states when he eats foods without a lot of spice or seasoning they just taste bland. He reports fatigue as well as pain in his low back as well as previously a few episodes of pain in the upper spine though his low back pain is more of a gnawing and aching concern that is constant but worse at night. He denies loss of sensation of his extremities or loss of control of bowel or bladder. No other complaints are verbalized.      PHYSICAL EXAM:  Wt Readings from Last 3 Encounters:  05/30/21 136 lb 9.6 oz (62 kg)  05/23/21 147 lb 11.2 oz (67 kg)   Temp Readings from Last 3 Encounters:  05/30/21 97.9 F (36.6 C)  05/23/21 98.3 F (36.8 C) (Oral)  05/08/21 97.6 F (36.4 C) (Oral)   BP Readings from Last 3 Encounters:  05/30/21 138/81  05/23/21 (!) 137/93  05/08/21 134/77   Pulse Readings from Last 3 Encounters:  05/30/21 (!) 101  05/23/21 92  05/08/21 93   Pain Assessment Pain Score: 3  (Little bit in his mid back)/10  In general this is a thin, chronically ill appearing African American male in no acute distress. He's alert and oriented x4 and appropriate throughout the examination. Cardiopulmonary assessment is negative for acute distress and hd exhibits normal effort but with ascultation his  rate is tachy about 105 bpm. His left lung fields are consolidated and no audible breath sounds are noted. He has scattered congested rhonci throughout the right lung field. The abdomen has active bowel sounds x4. The abdomen is soft, nontender, nondistended. No lower extremity edema is noted.     ECOG = 2  0 - Asymptomatic (Fully active, able to carry on all predisease activities without restriction)  1 - Symptomatic but completely ambulatory (Restricted in physically strenuous activity but ambulatory and able to carry out work of a light or sedentary nature. For example, light housework, office work)  2 - Symptomatic, <50% in bed during the day (Ambulatory and capable of all self care but unable to carry out any work activities. Up and about more than 50% of waking hours)  3 - Symptomatic, >50% in bed, but not bedbound (Capable of only limited self-care, confined to bed or chair 50% or more of waking hours)  4 - Bedbound (Completely disabled. Cannot carry on any self-care.  Totally confined to bed or chair)  5 - Death   Eustace Pen MM, Creech RH, Tormey DC, et al. [phone]). "Toxicity and response criteria of the [facility]". [place] Oncol. 5 (6): 649-55    LABORATORY DATA:  Lab Results  Component Value Date   WBC 5.8 05/22/2021   HGB 12.1 (L) 05/22/2021   HCT 38.5 (L) 05/22/2021   MCV 91.7 05/22/2021   PLT 297 05/22/2021   Lab Results  Component Value Date   NA 136 05/23/2021   K 3.5 05/23/2021   CL 100 05/23/2021   CO2 27 05/23/2021   Lab Results  Component Value Date   ALT 9 05/22/2021   AST 16 05/22/2021   ALKPHOS 72 05/22/2021   BILITOT 0.4 05/22/2021      RADIOGRAPHY: DG Chest 2 View  Result Date: 05/17/2021 CLINICAL DATA:  Lower back pain, status post fall. EXAM: CHEST - 2 VIEW COMPARISON:  None. FINDINGS: There is complete opacification of the left hemithorax with a very small amount of aerated lung noted within the left lung base. The right  lung is mildly hyperinflated and otherwise clear. Right to left shift of the cardiac silhouette is noted which is subsequently limited in evaluation. There is marked severity calcification of the thoracic aorta. Ill-defined deformities of the third, sixth and seventh left ribs are seen. IMPRESSION: Findings likely consistent with partial collapse of the left lung with associated atelectasis, infiltrate and pleural effusion. Correlation with chest CT is recommended, as an underlying mass cannot be excluded. Electronically Signed   By: Virgina Norfolk M.D.   On: 05/17/2021 23:28   DG Lumbar Spine Complete  Result Date: 05/17/2021 CLINICAL DATA:  Status post fall with lower back pain. EXAM: LUMBAR SPINE - COMPLETE 4+ VIEW COMPARISON:  None. FINDINGS: There is no evidence of acute lumbar spine fracture. Alignment is normal. Mild multilevel endplate sclerosis is seen. Intervertebral disc spaces are maintained. Moderate severity calcification of the abdominal aorta is noted. IMPRESSION: Multilevel degenerative changes without an acute lumbar spine fracture or subluxation. Electronically Signed   By: Virgina Norfolk M.D.   On: 05/17/2021 23:33   MR BRAIN W WO CONTRAST  Result Date: 05/18/2021 CLINICAL DATA:  Left chest malignancy.  Staging. EXAM: MRI HEAD WITHOUT AND WITH CONTRAST TECHNIQUE: Multiplanar, multiecho pulse sequences of the brain and surrounding structures were obtained without and with intravenous contrast. CONTRAST:  35mL GADAVIST GADOBUTROL 1 MMOL/ML IV SOLN COMPARISON:  None. FINDINGS: Brain: The brain itself has a normal appearance without evidence atrophy, old or acute small or large vessel infarction, primary or metastatic mass lesion, hemorrhage, hydrocephalus or extra-axial collection. No abnormal contrast enhancement occurs. Vascular: Major vessels at the base of the brain show flow. Skull and upper cervical spine: Question abnormal signal in the tip of the clivus in the dens. Osseous  metastatic disease not excluded. Sinuses/Orbits: Opacified maxillary sinuses, more extensively on the right than the left. Orbits negative. Other: Frontal scalp lipoma. IMPRESSION: Motion degraded study, but without evidence of intracranial metastatic disease. Question abnormal marrow signal at the tip of the clivus and affecting the dens. Metastatic disease to the bones of these levels not excluded. Maxillary sinus opacification/inflammatory change, worse on the right than the left. Electronically Signed   By: Nelson Chimes M.D.   On: 05/18/2021 17:19   CT CHEST ABDOMEN PELVIS W CONTRAST  Result Date: 05/18/2021 CLINICAL DATA:  Lower back pain status post fall. Partial collapse of left lung with associated infiltrate  and pleural effusion. Question underlying mass on chest x-ray. EXAM: CT CHEST, ABDOMEN, AND PELVIS WITH CONTRAST CT LUMBAR SPINE WITHOUT CONTRAST TECHNIQUE: Multidetector CT imaging of the chest, abdomen and pelvis was performed following the standard protocol during bolus administration of intravenous contrast. Multidetector CT imaging of the lumbar spine was performed without intravenous contrast administration. Multiplanar CT image reconstructions were also generated. CONTRAST:  133mL OMNIPAQUE IOHEXOL 300 MG/ML  SOLN COMPARISON:  Chest x-ray 05/17/2021 FINDINGS: CT CHEST FINDINGS Cardiovascular: Normal heart size. No significant pericardial effusion. The thoracic aorta is normal in caliber. Mild-to-moderate atherosclerotic plaque of the thoracic aorta. At least 3 vessel coronary artery calcifications. The main pulmonary artery is enlarged in caliber measuring up to 3.7 cm. No central or proximal segmental pulmonary embolus. Mediastinum/Nodes: Mediastinal lymphadenopathy with as an example a 1.5 cm precarinal lymph node (2:27). Also noted subcarinal lymph node measuring 1.6 cm (2:33). No definite right hilar lymphadenopathy. Left hilar lymphadenopathy unable to measure. Lungs/Pleura: Almost  complete heterogeneous opacification of the left hemithorax with associated pleural thickening and nodularity. Underlying pulmonary mass is difficult to measure. Invasion into the mediastinum is noted. Associated narrowing of the left main pulmonary artery as well as left mainstem bronchus. Associated loculated left pleural effusion. Several right lower lobe pulmonary nodule measuring up to 0.9 x 0.5 cm (4:121). Calcified pulmonary nodule within the right upper lobe. Pulmonary micronodule within the right upper lobe (4:35). Debris within the right mainstem bronchus and trachea. As well as within the right lower lobe bronchials. Musculoskeletal: Scattered axial and appendicular skeleton lytic lesions. Question pathologic nondisplaced fracture of the right posterior tenth rib (2:49). CT ABDOMEN PELVIS FINDINGS Hepatobiliary: No focal liver abnormality. No gallstones, gallbladder wall thickening, or pericholecystic fluid. No biliary dilatation. Pancreas: No focal lesion. Normal pancreatic contour. No surrounding inflammatory changes. No main pancreatic ductal dilatation. Spleen: Normal in size without focal abnormality. Adrenals/Urinary Tract: No right adrenal nodule. There is a retroperitoneal left upper 3.3 x 2.1 cm lobulated lesion that may be arising from the left adrenal gland. Bilateral kidneys enhance symmetrically. No hydronephrosis. No hydroureter. The urinary bladder is unremarkable. Stomach/Bowel: Stomach is within normal limits. No evidence of bowel wall thickening or dilatation. Appendix appears normal. Vascular/Lymphatic: No abdominal aorta or iliac aneurysm. Moderate atherosclerotic plaque of the aorta and its branches. No pelvic or inguinal lymphadenopathy. Reproductive: The prostate is enlarged measuring up to 5.1 cm. Other: No intraperitoneal free fluid. No intraperitoneal free gas. No organized fluid collection. Musculoskeletal: Scattered axial and appendicular skeleton lytic lesions. CT LUMBAR  SPINE: Segmentation: 5 non-rib-bearing lumbar vertebral bodies. Alignment: Normal. Vertebra: Several lytic lesions with cortical destruction, as an example: Posterior wall and inferior endplate of the L4 level (504:22), right iliac bone (3:97), S1 level (504:22, 3:101). Soft tissues: Question associated soft tissue densities along the lytic lesions with as an example along the right iliac bone (2:91). IMPRESSION: 1. 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 Underlying pulmonary mass difficult to measure/visualize. Associated loculated left pleural effusion. Invasion into the mediastinum with associated narrowing of the left main pulmonary artery as well as left mainstem bronchus. 2. Mediastinal and left hilar lymphadenopathy. 3. Several right lower lobe pulmonary nodules measuring up to 0.9 x 0.5 cm. Findings could represent metastases versus infection/inflammation given debris within the right mainstem bronchus and trachea as well as within the right lower lobe bronchioles with associated right lower lobe mucous plugging. 4. Axial and appendicular skeleton lytic lesions with cortical destruction consistent with metastases. 5.  Question pathologic nondisplaced fracture of the right posterior tenth rib. 6. A left retroperitoneal 3.3 x 2.1 cm lobulated lesion may be arising from the left adrenal gland versus represent a lymph node. Finding likely metastasis. 7. Prostatomegaly. Electronically Signed   By: Iven Finn M.D.   On: 05/18/2021 02:05   NM PET Image Initial (PI) Skull Base To Thigh  Result Date: 05/27/2021 CLINICAL DATA:  Initial treatment strategy for non-small cell lung cancer. EXAM: NUCLEAR MEDICINE PET SKULL BASE TO THIGH TECHNIQUE: 8.09 mCi F-18 FDG was injected intravenously. Full-ring PET imaging was performed from the skull base to thigh after the radiotracer. CT data was obtained and used for attenuation correction and anatomic  localization. Fasting blood glucose: 76 mg/dl COMPARISON:  CT chest 05/18/2021 FINDINGS: Mediastinal blood pool activity: SUV max 1.26 Liver activity: SUV max NA NECK: No hypermetabolic lymph nodes in the neck. Incidental CT findings: none CHEST: As noted previously there is extensive tumor involving nearly the entire left hemithorax which is markedly FDG avid on today's exam. This measures at least 20 cm maximum dimension. Relative decreased FDG uptake within the central, perihilar left lung compatible atelectatic lung and/or tumor necrosis. There is a loculated pleural effusion at the base of the left hemithorax which measures 9.8 x 4.3 cm, image 84/4. Tumor invasion of the left hilum and left mediastinum with encasement and marked narrowing of the left mainstem bronchus. Tumor also appears to extend through the lateral aspect of the left hemidiaphragm into the left upper quadrant of the abdomen. FDG avid subcarinal, bilateral mediastinal and subcarinal adenopathy identified, including: -right paratracheal lymph node has an SUV max of 11.42. -subcarinal lymph node has an SUV max of 8.41. FDG avid posterior mediastinal lymph nodes are also noted including a right-sided aortoesophageal lymph node measuring 1.9 cm with SUV max of 15.3, image 75/4 Incidental CT findings: Aortic atherosclerosis. Coronary artery calcifications. ABDOMEN/PELVIS: No abnormal FDG uptake within the liver, pancreas, or spleen. Normal adrenal glands. FDG avid upper abdominal lymph nodes are identified, including: -gastrohepatic ligament lymph node with SUV max of 5.68. -left retroperitoneal lymph node measuring 1.8 cm within SUV max of 4.4, image 99/4 Incidental CT findings: Aortic atherosclerosis. SKELETON: Extensive FDG avid lytic bone metastases are identified throughout the axial and appendicular skeleton. These lesions are too numerous to count, including: FDG avid T10 lesion which has an SUV max of 24.10 with FDG avid left posteromedial  rib lesion at the costovertebral junction measures 1.2 cm with SUV max of 19.2, image 86/4. Lytic lesion within the right iliac bone measures 2.9 cm and has an SUV max of 26, image 130/4. Large lytic lesion involving the left side of the sacrum measures 3.3 cm and has an SUV max of 29, image 136/4. FDG avid lytic lesion involving the right pedicle of the T5 vertebra with signs of spinal involvement with possible mass effect upon the cord is noted. This measures 2.1 cm with SUV max of 18.57, image 39/4. Incidental CT findings: none IMPRESSION: 1. Examination shows extensive FDG avid tumor involving nearly the entire left hemithorax. Tumor invades the left hilum and left mediastinum with marked narrowing of the left mainstem bronchus. Tumor also appears to extend through the left hemidiaphragm into the left lower quadrant of the abdomen. 2. FDG avid bilateral mediastinal, subcarinal, and upper abdominal nodal metastasis. 3. Widespread lytic bone metastases involving the axial and appendicular skeleton. Bone lesions are too numerous to count. 4. FDG avid lytic lesion involving the pedicle of T5  is noted with central canal extension. This may be of neuro surgical significance. Consider further evaluation with contrast enhanced MRI of the thoracic spine to assess for canal involvement by disease. 5. These results will be called to the ordering clinician or representative by the Radiologist Assistant, and communication documented in the PACS or [employer]. Electronically Signed   By: Kerby Moors M.D.   On: 05/27/2021 17:09   CT L-SPINE NO CHARGE  Result Date: 05/18/2021 CLINICAL DATA:  Lower back pain status post fall. Partial collapse of left lung with associated infiltrate and pleural effusion. Question underlying mass on chest x-ray. EXAM: CT CHEST, ABDOMEN, AND PELVIS WITH CONTRAST CT LUMBAR SPINE WITHOUT CONTRAST TECHNIQUE: Multidetector CT imaging of the chest, abdomen and pelvis was performed following  the standard protocol during bolus administration of intravenous contrast. Multidetector CT imaging of the lumbar spine was performed without intravenous contrast administration. Multiplanar CT image reconstructions were also generated. CONTRAST:  180mL OMNIPAQUE IOHEXOL 300 MG/ML  SOLN COMPARISON:  Chest x-ray 05/17/2021 FINDINGS: CT CHEST FINDINGS Cardiovascular: Normal heart size. No significant pericardial effusion. The thoracic aorta is normal in caliber. Mild-to-moderate atherosclerotic plaque of the thoracic aorta. At least 3 vessel coronary artery calcifications. The main pulmonary artery is enlarged in caliber measuring up to 3.7 cm. No central or proximal segmental pulmonary embolus. Mediastinum/Nodes: Mediastinal lymphadenopathy with as an example a 1.5 cm precarinal lymph node (2:27). Also noted subcarinal lymph node measuring 1.6 cm (2:33). No definite right hilar lymphadenopathy. Left hilar lymphadenopathy unable to measure. Lungs/Pleura: Almost complete heterogeneous opacification of the left hemithorax with associated pleural thickening and nodularity. Underlying pulmonary mass is difficult to measure. Invasion into the mediastinum is noted. Associated narrowing of the left main pulmonary artery as well as left mainstem bronchus. Associated loculated left pleural effusion. Several right lower lobe pulmonary nodule measuring up to 0.9 x 0.5 cm (4:121). Calcified pulmonary nodule within the right upper lobe. Pulmonary micronodule within the right upper lobe (4:35). Debris within the right mainstem bronchus and trachea. As well as within the right lower lobe bronchials. Musculoskeletal: Scattered axial and appendicular skeleton lytic lesions. Question pathologic nondisplaced fracture of the right posterior tenth rib (2:49). CT ABDOMEN PELVIS FINDINGS Hepatobiliary: No focal liver abnormality. No gallstones, gallbladder wall thickening, or pericholecystic fluid. No biliary dilatation. Pancreas: No focal  lesion. Normal pancreatic contour. No surrounding inflammatory changes. No main pancreatic ductal dilatation. Spleen: Normal in size without focal abnormality. Adrenals/Urinary Tract: No right adrenal nodule. There is a retroperitoneal left upper 3.3 x 2.1 cm lobulated lesion that may be arising from the left adrenal gland. Bilateral kidneys enhance symmetrically. No hydronephrosis. No hydroureter. The urinary bladder is unremarkable. Stomach/Bowel: Stomach is within normal limits. No evidence of bowel wall thickening or dilatation. Appendix appears normal. Vascular/Lymphatic: No abdominal aorta or iliac aneurysm. Moderate atherosclerotic plaque of the aorta and its branches. No pelvic or inguinal lymphadenopathy. Reproductive: The prostate is enlarged measuring up to 5.1 cm. Other: No intraperitoneal free fluid. No intraperitoneal free gas. No organized fluid collection. Musculoskeletal: Scattered axial and appendicular skeleton lytic lesions. CT LUMBAR SPINE: Segmentation: 5 non-rib-bearing lumbar vertebral bodies. Alignment: Normal. Vertebra: Several lytic lesions with cortical destruction, as an example: Posterior wall and inferior endplate of the L4 level (504:22), right iliac bone (3:97), S1 level (504:22, 3:101). Soft tissues: Question associated soft tissue densities along the lytic lesions with as an example along the right iliac bone (2:91). IMPRESSION: 1. Left hemithorax malignancy with almost complete opacification and associated  pleural thickening/nodularity. Underlying pulmonary mass difficult to measure/visualize. Associated loculated left pleural effusion. Invasion into the mediastinum with associated narrowing of the left main pulmonary artery as well as left mainstem bronchus. 2. Mediastinal and left hilar lymphadenopathy. 3. Several right lower lobe pulmonary nodules measuring up to 0.9 x 0.5 cm. Findings could represent metastases versus infection/inflammation given debris within the right  mainstem bronchus and trachea as well as within the right lower lobe bronchioles with associated right lower lobe mucous plugging. 4. Axial and appendicular skeleton lytic lesions with cortical destruction consistent with metastases. 5. Question pathologic nondisplaced fracture of the right posterior tenth rib. 6. A left retroperitoneal 3.3 x 2.1 cm lobulated lesion may be arising from the left adrenal gland versus represent a lymph node. Finding likely metastasis. 7. Prostatomegaly. Electronically Signed   By: Iven Finn M.D.   On: 05/18/2021 02:05   CT BIOPSY  Result Date: 05/20/2021 INDICATION: 65 year old male with significant left hemithorax malignant process and multifocal lytic osseous lesions concerning for primary bronchogenic carcinoma with bone metastases. He presents for CT-guided biopsy of 1 of the lytic bone lesions. EXAM: CT-guided core biopsy right iliac bone lesion MEDICATIONS: None. ANESTHESIA/SEDATION: Moderate (conscious) sedation was employed during this procedure. A total of Versed 1 mg and Fentanyl 50 mcg was administered intravenously. Moderate Sedation Time: 10 minutes. The patient's level of consciousness and vital signs were monitored continuously by radiology nursing throughout the procedure under my direct supervision. FLUOROSCOPY TIME:  None. COMPLICATIONS: None immediate. PROCEDURE: Informed written consent was obtained from the patient after a thorough discussion of the procedural risks, benefits and alternatives. All questions were addressed. Maximal Sterile Barrier Technique was utilized including caps, mask, sterile gowns, sterile gloves, sterile drape, hand hygiene and skin antiseptic. A timeout was performed prior to the initiation of the procedure. A planning CT scan was performed. The lytic lesion in the right posterior iliac bone was identified. The overlying skin was prepped and marked. Local anesthesia was attained by infiltration with 1% lidocaine. A small  dermatotomy was made. Under intermittent CT guidance, a 13 gauge trocar needle was advanced through the cortex and positioned at the margin of the lytic component of the mass. Multiple 11 gauge core biopsies were then obtained coaxially using the OnControl biopsy device. Biopsy specimens were placed in formalin and delivered to pathology for further analysis. IMPRESSION: Technically successful CT-guided core biopsy of right lytic iliac bone lesion. Electronically Signed   By: Jacqulynn Cadet M.D.   On: 05/20/2021 12:32   DG CHEST PORT 1 VIEW  Result Date: 05/21/2021 CLINICAL DATA:  Fall.  Back pain.  Shortness of breath. EXAM: PORTABLE CHEST 1 VIEW COMPARISON:  05/20/2021.  CT 05/18/2021. FINDINGS: Complete opacification of the left hemithorax with volume loss again noted. No interim change. Right lung pulmonary nodules best identified by prior CT. No pneumothorax. Heart size cannot be assessed. Lytic lesions of the left sixth and seventh ribs again noted. This is again consistent metastatic disease. IMPRESSION: 1. Complete opacification of the left hemithorax with volume loss again noted. No interim change. Right pulmonary nodules best identified by prior CT. Chest is unchanged from prior exam. 2. Lytic lesions of the left sixth and seventh ribs again noted. This is again consistent with metastatic disease. Electronically Signed   By: Marcello Moores  Register   On: 05/21/2021 06:01   DG CHEST PORT 1 VIEW  Result Date: 05/20/2021 CLINICAL DATA:  Shortness of breath. EXAM: PORTABLE CHEST 1 VIEW COMPARISON:  May 17, 2021.  May 18, 2021. FINDINGS: There remains complete consolidation of the left hemithorax consistent with probable mass and effusion as noted on prior CT exam. Right lung is unremarkable. No definite pneumothorax is noted. Lytic lesions are seen involving the lateral portions of the left sixth and seventh ribs consistent with metastatic disease. IMPRESSION: Stable complete consolidation of the left  hemithorax as described above. Lytic lesions are noted in the left sixth and seventh ribs consistent with metastatic disease. Electronically Signed   By: Marijo Conception M.D.   On: 05/20/2021 08:45       IMPRESSION/PLAN: 1. 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 Dr. Lisbeth Renshaw discusses the pathology findings and reviews the nature of  metastatic lung disease. We discussed the risks, benefits, short, and long term effects of radiotherapy, as well as the palliative intent, and the patient is interested in proceeding. Dr. Lisbeth Renshaw discusses the delivery and logistics of radiotherapy and anticipates a course of 3 weeks of radiotherapy as (he will soon start systemic therapy) to the lumbosacral spine and the left chest including T5 within the same isocenter. Written consent is obtained and placed in the chart, a copy was provided to the patient. He will simulate tomorrow and begin treatment on 06/03/21.  In a visit lasting 60 minutes, greater than 50% of the time was spent face to face discussing the patient's condition, in preparation for the discussion, and coordinating the patient's care.    The above documentation reflects my direct findings during this shared patient visit. Please see the separate note by Dr. Lisbeth Renshaw on this date for the remainder of the patient's plan of care.    Carola Rhine, [facility]   **Disclaimer: This note was dictated with voice recognition software. Similar sounding words can inadvertently be transcribed and this note may contain transcription errors which may not have been corrected upon publication of note.**

## 2021-05-31 ENCOUNTER — Ambulatory Visit
Admission: RE | Admit: 2021-05-31 | Discharge: 2021-05-31 | Disposition: A | Discharge status: Home / Self Care | Payer: Medicaid Other | Source: Ambulatory Visit | Attending: Radiation Oncology | Admitting: Radiation Oncology

## 2021-05-31 DIAGNOSIS — C7951 Secondary malignant neoplasm of bone: Secondary | ICD-10-CM | POA: Diagnosis not present

## 2021-05-31 DIAGNOSIS — Z510 Encounter for antineoplastic radiation therapy: Secondary | ICD-10-CM | POA: Insufficient documentation

## 2021-06-03 ENCOUNTER — Ambulatory Visit
Admission: RE | Admit: 2021-06-03 | Discharge: 2021-06-03 | Disposition: A | Discharge status: Home / Self Care | Payer: Medicaid Other | Source: Ambulatory Visit | Attending: Radiation Oncology | Admitting: Radiation Oncology

## 2021-06-03 ENCOUNTER — Encounter (HOSPITAL_COMMUNITY): Payer: Self-pay

## 2021-06-03 DIAGNOSIS — Z510 Encounter for antineoplastic radiation therapy: Secondary | ICD-10-CM | POA: Diagnosis not present

## 2021-06-04 ENCOUNTER — Other Ambulatory Visit: Payer: Self-pay | Admitting: Radiation Oncology

## 2021-06-04 ENCOUNTER — Ambulatory Visit
Admission: RE | Admit: 2021-06-04 | Discharge: 2021-06-04 | Disposition: A | Discharge status: Home / Self Care | Payer: Medicaid Other | Source: Ambulatory Visit | Attending: Radiation Oncology | Admitting: Radiation Oncology

## 2021-06-04 ENCOUNTER — Other Ambulatory Visit: Payer: Self-pay

## 2021-06-04 DIAGNOSIS — Z510 Encounter for antineoplastic radiation therapy: Secondary | ICD-10-CM | POA: Diagnosis not present

## 2021-06-04 MED ORDER — HYDROCODONE-ACETAMINOPHEN 5-325 MG PO TABS: 1.0000 | ORAL_TABLET | Freq: Four times a day (QID) | ORAL | 0 refills | Status: AC | PRN

## 2021-06-05 ENCOUNTER — Ambulatory Visit
Admission: RE | Admit: 2021-06-05 | Discharge: 2021-06-05 | Disposition: A | Discharge status: Home / Self Care | Payer: Medicaid Other | Source: Ambulatory Visit | Attending: Radiation Oncology | Admitting: Radiation Oncology

## 2021-06-05 ENCOUNTER — Inpatient Hospital Stay: Payer: Medicaid Other | Attending: Hematology and Oncology | Admitting: Hematology and Oncology

## 2021-06-05 ENCOUNTER — Inpatient Hospital Stay: Payer: Medicaid Other

## 2021-06-05 VITALS — BP 121/76 | HR 117 | Temp 96.9°F | Resp 20 | Ht 69.0 in | Wt 131.2 lb

## 2021-06-05 DIAGNOSIS — Z87891 Personal history of nicotine dependence: Secondary | ICD-10-CM | POA: Diagnosis not present

## 2021-06-05 DIAGNOSIS — M545 Low back pain, unspecified: Secondary | ICD-10-CM | POA: Diagnosis not present

## 2021-06-05 DIAGNOSIS — Z510 Encounter for antineoplastic radiation therapy: Secondary | ICD-10-CM | POA: Diagnosis not present

## 2021-06-05 DIAGNOSIS — Z800 Family history of malignant neoplasm of digestive organs: Secondary | ICD-10-CM | POA: Diagnosis not present

## 2021-06-05 DIAGNOSIS — R630 Anorexia: Secondary | ICD-10-CM | POA: Insufficient documentation

## 2021-06-05 DIAGNOSIS — G893 Neoplasm related pain (acute) (chronic): Secondary | ICD-10-CM | POA: Insufficient documentation

## 2021-06-05 DIAGNOSIS — C7951 Secondary malignant neoplasm of bone: Secondary | ICD-10-CM | POA: Diagnosis not present

## 2021-06-05 DIAGNOSIS — R634 Abnormal weight loss: Secondary | ICD-10-CM | POA: Diagnosis not present

## 2021-06-05 DIAGNOSIS — C778 Secondary and unspecified malignant neoplasm of lymph nodes of multiple regions: Secondary | ICD-10-CM | POA: Insufficient documentation

## 2021-06-05 DIAGNOSIS — C3492 Malignant neoplasm of unspecified part of left bronchus or lung: Secondary | ICD-10-CM | POA: Insufficient documentation

## 2021-06-06 ENCOUNTER — Ambulatory Visit
Admission: RE | Admit: 2021-06-06 | Discharge: 2021-06-06 | Disposition: A | Discharge status: Home / Self Care | Payer: Medicaid Other | Source: Ambulatory Visit | Attending: Radiation Oncology | Admitting: Radiation Oncology

## 2021-06-06 ENCOUNTER — Inpatient Hospital Stay: Payer: Medicaid Other

## 2021-06-06 ENCOUNTER — Encounter: Payer: Self-pay | Admitting: Hematology and Oncology

## 2021-06-06 ENCOUNTER — Other Ambulatory Visit: Payer: Self-pay

## 2021-06-06 DIAGNOSIS — Z510 Encounter for antineoplastic radiation therapy: Secondary | ICD-10-CM | POA: Diagnosis not present

## 2021-06-06 MED ORDER — LIDOCAINE-PRILOCAINE 2.5-2.5 % EX CREA: TOPICAL_CREAM | CUTANEOUS | 3 refills | Status: AC

## 2021-06-06 MED ORDER — ONDANSETRON HCL 8 MG PO TABS: 8.0000 mg | ORAL_TABLET | Freq: Two times a day (BID) | ORAL | 1 refills | Status: AC | PRN

## 2021-06-06 MED ORDER — DEXAMETHASONE 4 MG PO TABS: 8.0000 mg | ORAL_TABLET | Freq: Every day | ORAL | 1 refills | Status: AC

## 2021-06-06 MED ORDER — PROCHLORPERAZINE MALEATE 10 MG PO TABS: 10.0000 mg | ORAL_TABLET | Freq: Four times a day (QID) | ORAL | 1 refills | Status: AC | PRN

## 2021-06-06 NOTE — Progress Notes (Signed)
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  He is pain is very well controlled since he started radiation. PD-L1 was 10% and no other targeted mutations noted on the foundation 1 testing.  Given this information we have discussed about first-line chemoimmunotherapy with carboplatin, paclitaxel and Keytruda.  We have discussed about mechanism of action, adverse effects of chemotherapy including but not limited to fatigue, nausea, vomiting, diarrhea, increased risk of infections, neuropathy, myelosuppression, immunotherapy related adverse effects.  I have discussed that immunotherapy can cause fatal adverse effects in 1 to 2% of the patients can virtually affect any organ system but in most of the patients has been very well-tolerated.  He understands that the adverse effects of chemoimmunotherapy can be life-threatening however he would like to proceed with treatment at this time.  We have discussed about port placement and he is agreeable to this.  Chemo education has been scheduled.  We will anticipate for cycle of chemotherapy the week of July 11 to minimize overlap with radiation and to minimize toxicity. We have discussed that the intent of treatment is palliative given metastatic disease.  We will try to repeat scans after 2 cycles to assess response.  2.  Cancer related pain.  He is currently taking Tylenol as needed, has seen improvement in pain since  initiation of radiation.  No concerns for spinal cord compression on review of systems and physical exam.  3.  Weight loss, likely related to active malignancy, according to sister, he has gained a few pounds since hospitalization and has been eating better.  We will continue to monitor this and consider nutrition referral if weight loss continues to become a problem.   HISTORY OF PRESENTING ILLNESS:   Jaymen Murren 64 y.o. male is here because of new diagnosis of Squamous cell carcinoma of the lung  This is a very pleasant 65 year old male patient, heavy smoker at baseline who presented with chief complaint of lower back pain and loss of appetite and loss of weight.  He was found to have imaging suggestive of left hemithorax malignancy with almost complete opacification and associated pleural thickening/nodularity.  Underlying pulmonary mass difficult to measure/visualize.  Bronchoscopy was attempted by Dr. Ebony Hail, unable to pass endobronchial ultrasound between vocal cords despite multiple attempts due to patient's anatomy cough and signs of bronchoscope.  Decision made to abort procedure for safety.   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  Given clinical presentation, this is consistent with lung primary.  CT abdomen showed retroperitoneal (3.3 x 2.1 cm lobulated lesion that may be arising from the left adrenal gland as well as scattered axial and appendicular skeleton lytic lesions.   MRI brain showed no evidence of intracranial metastatic disease but concern for metastatic disease to the bones at the tip of the clivus and affecting the dens.  When I first saw him in the hospital, we recommended outpatient PET, molecular and PDL1 testing. Outpatient PET 6/1/32022 showed extensive FDG avid  tumor involving nearly entire left hemithorax. Tumor invades the left hilum, left mediastinum with marked narrowing of the left mainstem bronchus also appears  to extend through the left hemidiaphragm into the left lower quadrant of the abdomen, FDG avid bilateral mediastinal, subcarinal and upper abdominal nodal metastasis.  Widespread lytic bone metastasis involving the axial and appendicular skeleton.  FDG avid lytic lesion involving the pedicle of T5 noted with central canal extension which may be of neurosurgical significance.  Given extensive left lung disease and delay in obtaining molecular's which can usually take about 21 business days, I have discussed the case with Dr. Salley Slaughter from radiation oncology.  He was seen by Dr. Lisbeth Renshaw who recommended 3 weeks of radiotherapy to the lumbar sacral spine and the left chest including T5 within the same isocenter.  He is currently undergoing radiation, last date of radiation is July 12.  He says his pain is quite bearable, currently rates it at about 3 out of 10, has been only using Tylenol.  He previously required narcotics.  No other new bony pains.  No weakness in the lower extremities, urinary retention/urinary incontinence, bowel retention/bowel incontinence.  Breathing is stable, he was able to maintain a conversation with the mask on without getting out of breath.  He has quit smoking.  His sister was with him during the appointment today. He may have lost a few pounds since his radiation visit but overall his sister says he has gained weight since his hospitalization.  Rest of the pertinent 10 point ROS reviewed and negative.  MEDICAL HISTORY:  No past medical history on file.  SURGICAL HISTORY: No past surgical history on file.  SOCIAL HISTORY: Social History   Socioeconomic History   Marital status: Single    Spouse name: Not on file   Number of children: Not on file   Years of education: Not on file   Highest education level: Not on file  Occupational History   Not on file  Tobacco Use   Smoking status: Former    Packs/day: 0.25    Pack years: 0.00    Types: Cigarettes   Smokeless  tobacco: Never  Substance and Sexual Activity   Alcohol use: Yes    Comment: occasional, denies hx of withdrawal    Drug use: Not on file   Sexual activity: Not on file  Other Topics Concern   Not on file  Social History Narrative   Not on file   Social Determinants of Health   Financial Resource Strain: Not on file  Food Insecurity: Not on file  Transportation Needs: Not on file  Physical Activity: Not on file  Stress: Not on file  Social Connections: Not on file  Intimate Partner Violence: Not on file    FAMILY HISTORY: Family History  Problem Relation Age of Onset   Colon cancer Mother     ALLERGIES:  has No Known Allergies.  MEDICATIONS:  Current Outpatient Medications  Medication Sig Dispense Refill   guaiFENesin (MUCINEX) 600 MG 12 hr tablet Take 1 tablet (600 mg total) by mouth 2 (two) times daily. 60 tablet 1   guaiFENesin-dextromethorphan (ROBITUSSIN DM) 100-10 MG/5ML syrup Take 5 mLs by mouth every 4 (four) hours as needed for cough. 118 mL 0   HYDROcodone-acetaminophen (NORCO/VICODIN) 5-325 MG tablet Take 1-2 tablets by mouth every 6 (six) hours as needed for moderate pain. 120 tablet 0   magnesium oxide (MAG-OX) 400 (240 Mg) MG tablet Take 1 tablet (400 mg total) by mouth  2 (two) times daily. 60 tablet 0   senna-docusate (SENOKOT-S) 8.6-50 MG tablet Take 1 tablet by mouth at bedtime as needed for mild constipation. 30 tablet 0   tiZANidine (ZANAFLEX) 4 MG capsule Take 1 capsule (4 mg total) by mouth 2 (two) times daily as needed for muscle spasms. 14 capsule 0   No current facility-administered medications for this visit.     PHYSICAL EXAMINATION: ECOG PERFORMANCE STATUS: 1 - Symptomatic but completely ambulatory  Vitals:   06/05/21 1312  BP: 121/76  Pulse: (!) 117  Resp: 20  Temp: (!) 96.9 F (36.1 C)  SpO2: 94%   Filed Weights   06/05/21 1312  Weight: 131 lb 3.2 oz (59.5 kg)    GENERAL:alert, no distress and comfortable, in wheel chair, no  oxygen dependence. SKIN: skin color, texture, turgor are normal, no rashes or significant lesions EYES: normal, conjunctiva are pink and non-injected, sclera clear OROPHARYNX:no exudate, no erythema and lips, buccal mucosa, and tongue normal  NECK: supple, thyroid normal size, non-tender, without nodularity LYMPH:  no palpable lymphadenopathy in the cervical, axillary or inguinal LUNGS: clear to auscultation and percussion with normal breathing effort HEART: regular rate & rhythm and no murmurs and no lower extremity edema ABDOMEN:abdomen soft, non-tender and normal bowel sounds Musculoskeletal:no cyanosis of digits and no clubbing  PSYCH: alert & oriented x 3 with fluent speech NEURO: no focal motor/sensory deficits  LABORATORY DATA:  I have reviewed the data as listed Lab Results  Component Value Date   WBC 5.8 05/22/2021   HGB 12.1 (L) 05/22/2021   HCT 38.5 (L) 05/22/2021   MCV 91.7 05/22/2021   PLT 297 05/22/2021     Chemistry      Component Value Date/Time   NA 136 05/23/2021 0348   K 3.5 05/23/2021 0348   CL 100 05/23/2021 0348   CO2 27 05/23/2021 0348   BUN 10 05/23/2021 0348   CREATININE 0.54 (L) 05/23/2021 0348      Component Value Date/Time   CALCIUM 8.1 (L) 05/23/2021 0348   ALKPHOS 72 05/22/2021 0403   AST 16 05/22/2021 0403   ALT 9 05/22/2021 0403   BILITOT 0.4 05/22/2021 0403       RADIOGRAPHIC STUDIES: I have personally reviewed the radiological images as listed and agreed with the findings in the report. DG Chest 2 View  Result Date: 05/17/2021 CLINICAL DATA:  Lower back pain, status post fall. EXAM: CHEST - 2 VIEW COMPARISON:  None. FINDINGS: There is complete opacification of the left hemithorax with a very small amount of aerated lung noted within the left lung base. The right lung is mildly hyperinflated and otherwise clear. Right to left shift of the cardiac silhouette is noted which is subsequently limited in evaluation. There is marked severity  calcification of the thoracic aorta. Ill-defined deformities of the third, sixth and seventh left ribs are seen. IMPRESSION: Findings likely consistent with partial collapse of the left lung with associated atelectasis, infiltrate and pleural effusion. Correlation with chest CT is recommended, as an underlying mass cannot be excluded. Electronically Signed   By: Virgina Norfolk M.D.   On: 05/17/2021 23:28   DG Lumbar Spine Complete  Result Date: 05/17/2021 CLINICAL DATA:  Status post fall with lower back pain. EXAM: LUMBAR SPINE - COMPLETE 4+ VIEW COMPARISON:  None. FINDINGS: There is no evidence of acute lumbar spine fracture. Alignment is normal. Mild multilevel endplate sclerosis is seen. Intervertebral disc spaces are maintained. Moderate severity calcification of the abdominal aorta  is noted. IMPRESSION: Multilevel degenerative changes without an acute lumbar spine fracture or subluxation. Electronically Signed   By: Virgina Norfolk M.D.   On: 05/17/2021 23:33   MR BRAIN W WO CONTRAST  Result Date: 05/18/2021 CLINICAL DATA:  Left chest malignancy.  Staging. EXAM: MRI HEAD WITHOUT AND WITH CONTRAST TECHNIQUE: Multiplanar, multiecho pulse sequences of the brain and surrounding structures were obtained without and with intravenous contrast. CONTRAST:  80m GADAVIST GADOBUTROL 1 MMOL/ML IV SOLN COMPARISON:  None. FINDINGS: Brain: The brain itself has a normal appearance without evidence atrophy, old or acute small or large vessel infarction, primary or metastatic mass lesion, hemorrhage, hydrocephalus or extra-axial collection. No abnormal contrast enhancement occurs. Vascular: Major vessels at the base of the brain show flow. Skull and upper cervical spine: Question abnormal signal in the tip of the clivus in the dens. Osseous metastatic disease not excluded. Sinuses/Orbits: Opacified maxillary sinuses, more extensively on the right than the left. Orbits negative. Other: Frontal scalp lipoma. IMPRESSION:  Motion degraded study, but without evidence of intracranial metastatic disease. Question abnormal marrow signal at the tip of the clivus and affecting the dens. Metastatic disease to the bones of these levels not excluded. Maxillary sinus opacification/inflammatory change, worse on the right than the left. Electronically Signed   By: MNelson ChimesM.D.   On: 05/18/2021 17:19   CT CHEST ABDOMEN PELVIS W CONTRAST  Result Date: 05/18/2021 CLINICAL DATA:  Lower back pain status post fall. Partial collapse of left lung with associated infiltrate and pleural effusion. Question underlying mass on chest x-ray. EXAM: CT CHEST, ABDOMEN, AND PELVIS WITH CONTRAST CT LUMBAR SPINE WITHOUT CONTRAST TECHNIQUE: Multidetector CT imaging of the chest, abdomen and pelvis was performed following the standard protocol during bolus administration of intravenous contrast. Multidetector CT imaging of the lumbar spine was performed without intravenous contrast administration. Multiplanar CT image reconstructions were also generated. CONTRAST:  1043mOMNIPAQUE IOHEXOL 300 MG/ML  SOLN COMPARISON:  Chest x-ray 05/17/2021 FINDINGS: CT CHEST FINDINGS Cardiovascular: Normal heart size. No significant pericardial effusion. The thoracic aorta is normal in caliber. Mild-to-moderate atherosclerotic plaque of the thoracic aorta. At least 3 vessel coronary artery calcifications. The main pulmonary artery is enlarged in caliber measuring up to 3.7 cm. No central or proximal segmental pulmonary embolus. Mediastinum/Nodes: Mediastinal lymphadenopathy with as an example a 1.5 cm precarinal lymph node (2:27). Also noted subcarinal lymph node measuring 1.6 cm (2:33). No definite right hilar lymphadenopathy. Left hilar lymphadenopathy unable to measure. Lungs/Pleura: Almost complete heterogeneous opacification of the left hemithorax with associated pleural thickening and nodularity. Underlying pulmonary mass is difficult to measure. Invasion into the  mediastinum is noted. Associated narrowing of the left main pulmonary artery as well as left mainstem bronchus. Associated loculated left pleural effusion. Several right lower lobe pulmonary nodule measuring up to 0.9 x 0.5 cm (4:121). Calcified pulmonary nodule within the right upper lobe. Pulmonary micronodule within the right upper lobe (4:35). Debris within the right mainstem bronchus and trachea. As well as within the right lower lobe bronchials. Musculoskeletal: Scattered axial and appendicular skeleton lytic lesions. Question pathologic nondisplaced fracture of the right posterior tenth rib (2:49). CT ABDOMEN PELVIS FINDINGS Hepatobiliary: No focal liver abnormality. No gallstones, gallbladder wall thickening, or pericholecystic fluid. No biliary dilatation. Pancreas: No focal lesion. Normal pancreatic contour. No surrounding inflammatory changes. No main pancreatic ductal dilatation. Spleen: Normal in size without focal abnormality. Adrenals/Urinary Tract: No right adrenal nodule. There is a retroperitoneal left upper 3.3 x 2.1 cm lobulated lesion  that may be arising from the left adrenal gland. Bilateral kidneys enhance symmetrically. No hydronephrosis. No hydroureter. The urinary bladder is unremarkable. Stomach/Bowel: Stomach is within normal limits. No evidence of bowel wall thickening or dilatation. Appendix appears normal. Vascular/Lymphatic: No abdominal aorta or iliac aneurysm. Moderate atherosclerotic plaque of the aorta and its branches. No pelvic or inguinal lymphadenopathy. Reproductive: The prostate is enlarged measuring up to 5.1 cm. Other: No intraperitoneal free fluid. No intraperitoneal free gas. No organized fluid collection. Musculoskeletal: Scattered axial and appendicular skeleton lytic lesions. CT LUMBAR SPINE: Segmentation: 5 non-rib-bearing lumbar vertebral bodies. Alignment: Normal. Vertebra: Several lytic lesions with cortical destruction, as an example: Posterior wall and inferior  endplate of the L4 level (504:22), right iliac bone (3:97), S1 level (504:22, 3:101). Soft tissues: Question associated soft tissue densities along the lytic lesions with as an example along the right iliac bone (2:91). IMPRESSION: 1. 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 Underlying pulmonary mass difficult to measure/visualize. Associated loculated left pleural effusion. Invasion into the mediastinum with associated narrowing of the left main pulmonary artery as well as left mainstem bronchus. 2. Mediastinal and left hilar lymphadenopathy. 3. Several right lower lobe pulmonary nodules measuring up to 0.9 x 0.5 cm. Findings could represent metastases versus infection/inflammation given debris within the right mainstem bronchus and trachea as well as within the right lower lobe bronchioles with associated right lower lobe mucous plugging. 4. Axial and appendicular skeleton lytic lesions with cortical destruction consistent with metastases. 5. Question pathologic nondisplaced fracture of the right posterior tenth rib. 6. A left retroperitoneal 3.3 x 2.1 cm lobulated lesion may be arising from the left adrenal gland versus represent a lymph node. Finding likely metastasis. 7. Prostatomegaly. Electronically Signed   By: Iven Finn M.D.   On: 05/18/2021 02:05   NM PET Image Initial (PI) Skull Base To Thigh  Result Date: 05/27/2021 CLINICAL DATA:  Initial treatment strategy for non-small cell lung cancer. EXAM: NUCLEAR MEDICINE PET SKULL BASE TO THIGH TECHNIQUE: 8.09 mCi F-18 FDG was injected intravenously. Full-ring PET imaging was performed from the skull base to thigh after the radiotracer. CT data was obtained and used for attenuation correction and anatomic localization. Fasting blood glucose: 76 mg/dl COMPARISON:  CT chest 05/18/2021 FINDINGS: Mediastinal blood pool activity: SUV max 1.26 Liver activity: SUV max NA NECK: No hypermetabolic  lymph nodes in the neck. Incidental CT findings: none CHEST: As noted previously there is extensive tumor involving nearly the entire left hemithorax which is markedly FDG avid on today's exam. This measures at least 20 cm maximum dimension. Relative decreased FDG uptake within the central, perihilar left lung compatible atelectatic lung and/or tumor necrosis. There is a loculated pleural effusion at the base of the left hemithorax which measures 9.8 x 4.3 cm, image 84/4. Tumor invasion of the left hilum and left mediastinum with encasement and marked narrowing of the left mainstem bronchus. Tumor also appears to extend through the lateral aspect of the left hemidiaphragm into the left upper quadrant of the abdomen. FDG avid subcarinal, bilateral mediastinal and subcarinal adenopathy identified, including: -right paratracheal lymph node has an SUV max of 11.42. -subcarinal lymph node has an SUV max of 8.41. FDG avid posterior mediastinal lymph nodes are also noted including a right-sided aortoesophageal lymph node measuring 1.9 cm with SUV max of 15.3, image 75/4 Incidental CT findings: Aortic atherosclerosis. Coronary artery calcifications. ABDOMEN/PELVIS: No abnormal FDG uptake within the liver, pancreas, or spleen. Normal adrenal glands. FDG  avid upper abdominal lymph nodes are identified, including: -gastrohepatic ligament lymph node with SUV max of 5.68. -left retroperitoneal lymph node measuring 1.8 cm within SUV max of 4.4, image 99/4 Incidental CT findings: Aortic atherosclerosis. SKELETON: Extensive FDG avid lytic bone metastases are identified throughout the axial and appendicular skeleton. These lesions are too numerous to count, including: FDG avid T10 lesion which has an SUV max of 24.10 with FDG avid left posteromedial rib lesion at the costovertebral junction measures 1.2 cm with SUV max of 19.2, image 86/4. Lytic lesion within the right iliac bone measures 2.9 cm and has an SUV max of 26, image  130/4. Large lytic lesion involving the left side of the sacrum measures 3.3 cm and has an SUV max of 29, image 136/4. FDG avid lytic lesion involving the right pedicle of the T5 vertebra with signs of spinal involvement with possible mass effect upon the cord is noted. This measures 2.1 cm with SUV max of 18.57, image 39/4. Incidental CT findings: none IMPRESSION: 1. Examination shows extensive FDG avid tumor involving nearly the entire left hemithorax. Tumor invades the left hilum and left mediastinum with marked narrowing of the left mainstem bronchus. Tumor also appears to extend through the left hemidiaphragm into the left lower quadrant of the abdomen. 2. FDG avid bilateral mediastinal, subcarinal, and upper abdominal nodal metastasis. 3. Widespread lytic bone metastases involving the axial and appendicular skeleton. Bone lesions are too numerous to count. 4. FDG avid lytic lesion involving the pedicle of T5 is noted with central canal extension. This may be of neuro surgical significance. Consider further evaluation with contrast enhanced MRI of the thoracic spine to assess for canal involvement by disease. 5. These results will be called to the ordering clinician or representative by the Radiologist Assistant, and communication documented in the PACS or [employer]. Electronically Signed   By: Kerby Moors M.D.   On: 05/27/2021 17:09   CT L-SPINE NO CHARGE  Result Date: 05/18/2021 CLINICAL DATA:  Lower back pain status post fall. Partial collapse of left lung with associated infiltrate and pleural effusion. Question underlying mass on chest x-ray. EXAM: CT CHEST, ABDOMEN, AND PELVIS WITH CONTRAST CT LUMBAR SPINE WITHOUT CONTRAST TECHNIQUE: Multidetector CT imaging of the chest, abdomen and pelvis was performed following the standard protocol during bolus administration of intravenous contrast. Multidetector CT imaging of the lumbar spine was performed without intravenous contrast administration.  Multiplanar CT image reconstructions were also generated. CONTRAST:  139mL OMNIPAQUE IOHEXOL 300 MG/ML  SOLN COMPARISON:  Chest x-ray 05/17/2021 FINDINGS: CT CHEST FINDINGS Cardiovascular: Normal heart size. No significant pericardial effusion. The thoracic aorta is normal in caliber. Mild-to-moderate atherosclerotic plaque of the thoracic aorta. At least 3 vessel coronary artery calcifications. The main pulmonary artery is enlarged in caliber measuring up to 3.7 cm. No central or proximal segmental pulmonary embolus. Mediastinum/Nodes: Mediastinal lymphadenopathy with as an example a 1.5 cm precarinal lymph node (2:27). Also noted subcarinal lymph node measuring 1.6 cm (2:33). No definite right hilar lymphadenopathy. Left hilar lymphadenopathy unable to measure. Lungs/Pleura: Almost complete heterogeneous opacification of the left hemithorax with associated pleural thickening and nodularity. Underlying pulmonary mass is difficult to measure. Invasion into the mediastinum is noted. Associated narrowing of the left main pulmonary artery as well as left mainstem bronchus. Associated loculated left pleural effusion. Several right lower lobe pulmonary nodule measuring up to 0.9 x 0.5 cm (4:121). Calcified pulmonary nodule within the right upper lobe. Pulmonary micronodule within the right upper lobe (4:35).  Debris within the right mainstem bronchus and trachea. As well as within the right lower lobe bronchials. Musculoskeletal: Scattered axial and appendicular skeleton lytic lesions. Question pathologic nondisplaced fracture of the right posterior tenth rib (2:49). CT ABDOMEN PELVIS FINDINGS Hepatobiliary: No focal liver abnormality. No gallstones, gallbladder wall thickening, or pericholecystic fluid. No biliary dilatation. Pancreas: No focal lesion. Normal pancreatic contour. No surrounding inflammatory changes. No main pancreatic ductal dilatation. Spleen: Normal in size without focal abnormality. Adrenals/Urinary  Tract: No right adrenal nodule. There is a retroperitoneal left upper 3.3 x 2.1 cm lobulated lesion that may be arising from the left adrenal gland. Bilateral kidneys enhance symmetrically. No hydronephrosis. No hydroureter. The urinary bladder is unremarkable. Stomach/Bowel: Stomach is within normal limits. No evidence of bowel wall thickening or dilatation. Appendix appears normal. Vascular/Lymphatic: No abdominal aorta or iliac aneurysm. Moderate atherosclerotic plaque of the aorta and its branches. No pelvic or inguinal lymphadenopathy. Reproductive: The prostate is enlarged measuring up to 5.1 cm. Other: No intraperitoneal free fluid. No intraperitoneal free gas. No organized fluid collection. Musculoskeletal: Scattered axial and appendicular skeleton lytic lesions. CT LUMBAR SPINE: Segmentation: 5 non-rib-bearing lumbar vertebral bodies. Alignment: Normal. Vertebra: Several lytic lesions with cortical destruction, as an example: Posterior wall and inferior endplate of the L4 level (504:22), right iliac bone (3:97), S1 level (504:22, 3:101). Soft tissues: Question associated soft tissue densities along the lytic lesions with as an example along the right iliac bone (2:91). IMPRESSION: 1. 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 Underlying pulmonary mass difficult to measure/visualize. Associated loculated left pleural effusion. Invasion into the mediastinum with associated narrowing of the left main pulmonary artery as well as left mainstem bronchus. 2. Mediastinal and left hilar lymphadenopathy. 3. Several right lower lobe pulmonary nodules measuring up to 0.9 x 0.5 cm. Findings could represent metastases versus infection/inflammation given debris within the right mainstem bronchus and trachea as well as within the right lower lobe bronchioles with associated right lower lobe mucous plugging. 4. Axial and appendicular skeleton lytic lesions  with cortical destruction consistent with metastases. 5. Question pathologic nondisplaced fracture of the right posterior tenth rib. 6. A left retroperitoneal 3.3 x 2.1 cm lobulated lesion may be arising from the left adrenal gland versus represent a lymph node. Finding likely metastasis. 7. Prostatomegaly. Electronically Signed   By: Iven Finn M.D.   On: 05/18/2021 02:05   CT BIOPSY  Result Date: 05/20/2021 INDICATION: 65 year old male with significant left hemithorax malignant process and multifocal lytic osseous lesions concerning for primary bronchogenic carcinoma with bone metastases. He presents for CT-guided biopsy of 1 of the lytic bone lesions. EXAM: CT-guided core biopsy right iliac bone lesion MEDICATIONS: None. ANESTHESIA/SEDATION: Moderate (conscious) sedation was employed during this procedure. A total of Versed 1 mg and Fentanyl 50 mcg was administered intravenously. Moderate Sedation Time: 10 minutes. The patient's level of consciousness and vital signs were monitored continuously by radiology nursing throughout the procedure under my direct supervision. FLUOROSCOPY TIME:  None. COMPLICATIONS: None immediate. PROCEDURE: Informed written consent was obtained from the patient after a thorough discussion of the procedural risks, benefits and alternatives. All questions were addressed. Maximal Sterile Barrier Technique was utilized including caps, mask, sterile gowns, sterile gloves, sterile drape, hand hygiene and skin antiseptic. A timeout was performed prior to the initiation of the procedure. A planning CT scan was performed. The lytic lesion in the right posterior iliac bone was identified. The overlying skin was prepped and marked. Local anesthesia was attained  by infiltration with 1% lidocaine. A small dermatotomy was made. Under intermittent CT guidance, a 13 gauge trocar needle was advanced through the cortex and positioned at the margin of the lytic component of the mass. Multiple 11  gauge core biopsies were then obtained coaxially using the OnControl biopsy device. Biopsy specimens were placed in formalin and delivered to pathology for further analysis. IMPRESSION: Technically successful CT-guided core biopsy of right lytic iliac bone lesion. Electronically Signed   By: Jacqulynn Cadet M.D.   On: 05/20/2021 12:32   DG CHEST PORT 1 VIEW  Result Date: 05/21/2021 CLINICAL DATA:  Fall.  Back pain.  Shortness of breath. EXAM: PORTABLE CHEST 1 VIEW COMPARISON:  05/20/2021.  CT 05/18/2021. FINDINGS: Complete opacification of the left hemithorax with volume loss again noted. No interim change. Right lung pulmonary nodules best identified by prior CT. No pneumothorax. Heart size cannot be assessed. Lytic lesions of the left sixth and seventh ribs again noted. This is again consistent metastatic disease. IMPRESSION: 1. Complete opacification of the left hemithorax with volume loss again noted. No interim change. Right pulmonary nodules best identified by prior CT. Chest is unchanged from prior exam. 2. Lytic lesions of the left sixth and seventh ribs again noted. This is again consistent with metastatic disease. Electronically Signed   By: Marcello Moores  Register   On: 05/21/2021 06:01   DG CHEST PORT 1 VIEW  Result Date: 05/20/2021 CLINICAL DATA:  Shortness of breath. EXAM: PORTABLE CHEST 1 VIEW COMPARISON:  May 17, 2021.  May 18, 2021. FINDINGS: There remains complete consolidation of the left hemithorax consistent with probable mass and effusion as noted on prior CT exam. Right lung is unremarkable. No definite pneumothorax is noted. Lytic lesions are seen involving the lateral portions of the left sixth and seventh ribs consistent with metastatic disease. IMPRESSION: Stable complete consolidation of the left hemithorax as described above. Lytic lesions are noted in the left sixth and seventh ribs consistent with metastatic disease. Electronically Signed   By: Marijo Conception M.D.   On: 05/20/2021  08:45    All questions were answered. The patient knows to call the clinic with any problems, questions or concerns. I spent 45 minutes in the care of this patient including H and P, review of records, counseling and coordination of care. We have reviewed PET findings, molecular testing results, treatment plan which in this case will consist of chemoimmunotherapy since he has no targeted therapy available and he is not a candidate for immunotherapy alone.  We will try to overlap the last week of radiation to minimize toxicity if necessary.  We have discussed about adverse effects of chemotherapy, discussed about role of port, need to repeat imaging after couple cycles to assess response.  He is agreeable and is ready to proceed with all the recommendations.    Benay Pike, MD 06/06/2021 9:36 AM

## 2021-06-06 NOTE — Progress Notes (Signed)
START ON PATHWAY REGIMEN - Non-Small Cell Lung     A cycle is every 21 days:     Pembrolizumab      Paclitaxel      Carboplatin   **Always confirm dose/schedule in your pharmacy ordering system**  Patient Characteristics: Stage IV Metastatic, Squamous, Molecular Analysis Completed, Alteration Present and Targeted Therapy Exhausted or EGFR Exon 20 Insertion or KRAS G12C Present, and No Prior Chemo/Immunotherapy or No Alteration Present, PS = 0, 1, Initial  Chemotherapy/Immunotherapy, Candidate for Immunotherapy, PD-L1 Expression Positive 1-49% (TPS) / Negative / Not Tested / Awaiting Test Results and Immunotherapy Candidate Therapeutic Status: Stage IV Metastatic Histology: Squamous Cell Molecular Analysis Results: No Alteration Present ECOG Performance Status: 1 Chemotherapy/Immunotherapy Line of Therapy: Initial Chemotherapy/Immunotherapy Immunotherapy Candidate Status: Candidate for Immunotherapy PD-L1 Expression Status: PD-L1 Positive 1-49% (TPS) Intent of Therapy: Non-Curative / Palliative Intent, Discussed with Patient

## 2021-06-07 ENCOUNTER — Ambulatory Visit
Admission: RE | Admit: 2021-06-07 | Discharge: 2021-06-07 | Disposition: A | Discharge status: Home / Self Care | Payer: Medicaid Other | Source: Ambulatory Visit | Attending: Radiation Oncology | Admitting: Radiation Oncology

## 2021-06-07 DIAGNOSIS — Z510 Encounter for antineoplastic radiation therapy: Secondary | ICD-10-CM | POA: Diagnosis not present

## 2021-06-10 ENCOUNTER — Encounter (HOSPITAL_COMMUNITY): Payer: Self-pay

## 2021-06-10 ENCOUNTER — Other Ambulatory Visit: Payer: Self-pay

## 2021-06-10 ENCOUNTER — Ambulatory Visit
Admission: RE | Admit: 2021-06-10 | Discharge: 2021-06-10 | Disposition: A | Discharge status: Home / Self Care | Payer: Medicaid Other | Source: Ambulatory Visit | Attending: Radiation Oncology | Admitting: Radiation Oncology

## 2021-06-10 ENCOUNTER — Emergency Department (HOSPITAL_COMMUNITY): Payer: Medicaid Other

## 2021-06-10 ENCOUNTER — Inpatient Hospital Stay (HOSPITAL_COMMUNITY)
Admission: EM | Admit: 2021-06-10 | Discharge: 2021-06-14 | DRG: 871 | Disposition: E | Payer: Medicaid Other | Attending: Critical Care Medicine | Admitting: Critical Care Medicine

## 2021-06-10 DIAGNOSIS — R64 Cachexia: Secondary | ICD-10-CM | POA: Diagnosis present

## 2021-06-10 DIAGNOSIS — C3492 Malignant neoplasm of unspecified part of left bronchus or lung: Secondary | ICD-10-CM | POA: Diagnosis present

## 2021-06-10 DIAGNOSIS — Z66 Do not resuscitate: Secondary | ICD-10-CM | POA: Diagnosis not present

## 2021-06-10 DIAGNOSIS — Z515 Encounter for palliative care: Secondary | ICD-10-CM

## 2021-06-10 DIAGNOSIS — R040 Epistaxis: Secondary | ICD-10-CM | POA: Diagnosis present

## 2021-06-10 DIAGNOSIS — E878 Other disorders of electrolyte and fluid balance, not elsewhere classified: Secondary | ICD-10-CM | POA: Diagnosis present

## 2021-06-10 DIAGNOSIS — Z800 Family history of malignant neoplasm of digestive organs: Secondary | ICD-10-CM

## 2021-06-10 DIAGNOSIS — C7951 Secondary malignant neoplasm of bone: Secondary | ICD-10-CM | POA: Diagnosis present

## 2021-06-10 DIAGNOSIS — Z20822 Contact with and (suspected) exposure to covid-19: Secondary | ICD-10-CM | POA: Diagnosis present

## 2021-06-10 DIAGNOSIS — G893 Neoplasm related pain (acute) (chronic): Secondary | ICD-10-CM | POA: Diagnosis present

## 2021-06-10 DIAGNOSIS — B964 Proteus (mirabilis) (morganii) as the cause of diseases classified elsewhere: Secondary | ICD-10-CM | POA: Diagnosis present

## 2021-06-10 DIAGNOSIS — E872 Acidosis: Secondary | ICD-10-CM | POA: Diagnosis present

## 2021-06-10 DIAGNOSIS — J9621 Acute and chronic respiratory failure with hypoxia: Secondary | ICD-10-CM | POA: Diagnosis present

## 2021-06-10 DIAGNOSIS — Z79899 Other long term (current) drug therapy: Secondary | ICD-10-CM

## 2021-06-10 DIAGNOSIS — D61818 Other pancytopenia: Secondary | ICD-10-CM | POA: Diagnosis present

## 2021-06-10 DIAGNOSIS — J13 Pneumonia due to Streptococcus pneumoniae: Secondary | ICD-10-CM | POA: Diagnosis present

## 2021-06-10 DIAGNOSIS — J9620 Acute and chronic respiratory failure, unspecified whether with hypoxia or hypercapnia: Secondary | ICD-10-CM | POA: Diagnosis present

## 2021-06-10 DIAGNOSIS — J9622 Acute and chronic respiratory failure with hypercapnia: Secondary | ICD-10-CM | POA: Diagnosis present

## 2021-06-10 DIAGNOSIS — R7303 Prediabetes: Secondary | ICD-10-CM | POA: Diagnosis present

## 2021-06-10 DIAGNOSIS — C7970 Secondary malignant neoplasm of unspecified adrenal gland: Secondary | ICD-10-CM | POA: Diagnosis present

## 2021-06-10 DIAGNOSIS — A403 Sepsis due to Streptococcus pneumoniae: Principal | ICD-10-CM | POA: Diagnosis present

## 2021-06-10 DIAGNOSIS — D630 Anemia in neoplastic disease: Secondary | ICD-10-CM | POA: Diagnosis present

## 2021-06-10 DIAGNOSIS — E43 Unspecified severe protein-calorie malnutrition: Secondary | ICD-10-CM | POA: Diagnosis present

## 2021-06-10 DIAGNOSIS — Z87891 Personal history of nicotine dependence: Secondary | ICD-10-CM

## 2021-06-10 DIAGNOSIS — R54 Age-related physical debility: Secondary | ICD-10-CM | POA: Diagnosis present

## 2021-06-10 DIAGNOSIS — R6521 Severe sepsis with septic shock: Secondary | ICD-10-CM | POA: Diagnosis present

## 2021-06-10 DIAGNOSIS — Z681 Body mass index (BMI) 19 or less, adult: Secondary | ICD-10-CM

## 2021-06-10 DIAGNOSIS — N179 Acute kidney failure, unspecified: Secondary | ICD-10-CM | POA: Diagnosis present

## 2021-06-10 DIAGNOSIS — C772 Secondary and unspecified malignant neoplasm of intra-abdominal lymph nodes: Secondary | ICD-10-CM | POA: Diagnosis present

## 2021-06-10 LAB — CBC WITH DIFFERENTIAL/PLATELET
Abs Immature Granulocytes: 0.01 10*3/uL (ref 0.00–0.07)
Basophils Absolute: 0 10*3/uL (ref 0.0–0.1)
Basophils Relative: 1 %
Eosinophils Absolute: 0 10*3/uL (ref 0.0–0.5)
Eosinophils Relative: 0 %
HCT: 32.6 % — ABNORMAL LOW (ref 39.0–52.0)
Hemoglobin: 10.2 g/dL — ABNORMAL LOW (ref 13.0–17.0)
Immature Granulocytes: 0 %
Lymphocytes Relative: 2 %
Lymphs Abs: 0.1 10*3/uL — ABNORMAL LOW (ref 0.7–4.0)
MCH: 29.1 pg (ref 26.0–34.0)
MCHC: 31.3 g/dL (ref 30.0–36.0)
MCV: 92.9 fL (ref 80.0–100.0)
Monocytes Absolute: 0 10*3/uL — ABNORMAL LOW (ref 0.1–1.0)
Monocytes Relative: 1 %
Neutro Abs: 3.5 10*3/uL (ref 1.7–7.7)
Neutrophils Relative %: 96 %
Platelets: 245 10*3/uL (ref 150–400)
RBC: 3.51 MIL/uL — ABNORMAL LOW (ref 4.22–5.81)
RDW: 15.7 % — ABNORMAL HIGH (ref 11.5–15.5)
WBC: 3.6 10*3/uL — ABNORMAL LOW (ref 4.0–10.5)
nRBC: 0 % (ref 0.0–0.2)

## 2021-06-10 LAB — COMPREHENSIVE METABOLIC PANEL
ALT: 14 U/L (ref 0–44)
AST: 28 U/L (ref 15–41)
Albumin: 2.5 g/dL — ABNORMAL LOW (ref 3.5–5.0)
Alkaline Phosphatase: 88 U/L (ref 38–126)
Anion gap: 15 (ref 5–15)
BUN: 40 mg/dL — ABNORMAL HIGH (ref 8–23)
CO2: 26 mmol/L (ref 22–32)
Calcium: 8.6 mg/dL — ABNORMAL LOW (ref 8.9–10.3)
Chloride: 100 mmol/L (ref 98–111)
Creatinine, Ser: 1.13 mg/dL (ref 0.61–1.24)
GFR, Estimated: >60 mL/min (ref >60)
Glucose, Bld: 114 mg/dL — ABNORMAL HIGH (ref 70–99)
Potassium: 4 mmol/L (ref 3.5–5.1)
Sodium: 141 mmol/L (ref 135–145)
Total Bilirubin: 0.8 mg/dL (ref 0.3–1.2)
Total Protein: 6.4 g/dL — ABNORMAL LOW (ref 6.5–8.1)

## 2021-06-10 LAB — APTT: aPTT: 32 seconds (ref 24–36)

## 2021-06-10 LAB — PROTIME-INR
INR: 1.1 (ref 0.8–1.2)
Prothrombin Time: 13.9 s (ref 11.4–15.2)

## 2021-06-10 LAB — RESP PANEL BY RT-PCR (FLU A&B, COVID) ARPGX2
Influenza A by PCR: NEGATIVE
Influenza B by PCR: NEGATIVE
SARS Coronavirus 2 by RT PCR: NEGATIVE

## 2021-06-10 LAB — MAGNESIUM: Magnesium: 2.3 mg/dL (ref 1.7–2.4)

## 2021-06-10 LAB — LACTIC ACID, PLASMA: Lactic Acid, Venous: 6.9 mmol/L (ref 0.5–1.9)

## 2021-06-10 MED ORDER — LACTATED RINGERS IV SOLN: INTRAVENOUS | Status: DC

## 2021-06-10 MED ORDER — ACETAMINOPHEN 325 MG PO TABS
650.0000 mg | ORAL_TABLET | Freq: Once | ORAL | Status: AC
  Administered 2021-06-10: 650 mg via ORAL
  Filled 2021-06-10: qty 2

## 2021-06-10 MED ORDER — LACTATED RINGERS IV BOLUS (SEPSIS)
1000.0000 mL | Freq: Once | INTRAVENOUS | Status: AC
  Administered 2021-06-10: 1000 mL via INTRAVENOUS

## 2021-06-10 MED ORDER — VANCOMYCIN HCL IN DEXTROSE 1-5 GM/200ML-% IV SOLN
1000.0000 mg | Freq: Once | INTRAVENOUS | Status: AC
  Administered 2021-06-11: 1000 mg via INTRAVENOUS
  Filled 2021-06-10: qty 200

## 2021-06-10 MED ORDER — SODIUM CHLORIDE 0.9 % IV SOLN
2.0000 g | Freq: Once | INTRAVENOUS | Status: AC
  Administered 2021-06-11: 2 g via INTRAVENOUS
  Filled 2021-06-10: qty 2

## 2021-06-10 NOTE — ED Triage Notes (Signed)
Pt had radiation today on his chest and pain. Pt start having nosebleeds earlier today and has been coughing. Pt is hypoxic in triage at 88% and placed on 2L. Pt is febrile in triage.

## 2021-06-10 NOTE — Progress Notes (Signed)
..  The following Medication: Beryle Flock has been approved thru [employer] as Risk manager. Enrollment period is 06/07/2021 to 06/07/2022.  Assistance ID: [id number]. Reason for Assistance: Self Pay.  First DOS: 06/26/2021.

## 2021-06-10 NOTE — Sepsis Progress Note (Signed)
Code Sepsis initiated @ 2234 PM, EL[facility] following.

## 2021-06-10 NOTE — ED Provider Notes (Signed)
[facility] DEPT Provider Note   CSN: [id number] Arrival date & time: 06/06/2021  2130     History Chief Complaint  Patient presents with   Fever   Cough    Bobby Pope is a 65 y.o. 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  The patient endorses fever and chills, onset today.  T-max at home 101.  No treatment prior to arrival.  The patient's sister called the oncology after-hours nurse to discuss his symptoms and was advised to bring him to the emergency department for further evaluation.  He is endorsing left upper chest pain.  Pain is nonradiating.  He is unable to characterize the pain.  He is unable to state how long that the pain has been present.  His sister reports that he has had a productive cough and a rattling noise in his chest since he was discharged from the hospital on 6/9. He denies shortness of breath, back pain  Family has noticed that his urine has been malodorous and "looks like tea" since yesterday. He did also have a nosebleed yesterday that resolved spontaneously.   The patient does endorse intermittent lower abdominal pain. He denies melena, hematochezia, hematemesis, rash, urinary frequency or hesitancy, decreased urinary output, dizziness, lightheadedness, syncope.  Current oncology regimen includes pembrolizumab, paclitaxel, and carboplatin.  Patient underwent radiation earlier today.  No known sick contacts.  The history is provided by the patient and medical records. No language interpreter was used.      History reviewed. No pertinent past medical history.  Patient Active Problem List   Diagnosis Date Noted   Acute and chronic  respiratory failure (acute-on-chronic) ([place]) 06/11/2021   Protein-calorie malnutrition, severe 05/20/2021   Hypercalcemia 05/18/2021   Hypomagnesemia 05/18/2021   Malignant neoplasm of left lung ([place])     History reviewed. No pertinent surgical history.     Family History  Problem Relation Age of Onset   Colon cancer Mother     Social History   Tobacco Use   Smoking status: Former    Packs/day: 0.25    Pack years: 0.00    Types: Cigarettes   Smokeless tobacco: Never  Substance Use Topics   Alcohol use: Yes    Comment: occasional, denies hx of withdrawal     Home Medications Prior to Admission medications   Medication Sig Start Date End Date Taking? Authorizing Provider  dexamethasone (DECADRON) 4 MG tablet Take 2 tablets (8 mg total) by mouth daily. Start the day after carboplatin chemotherapy for 3 days. 06/06/21   Benay Pike, MD  guaiFENesin (MUCINEX) 600 MG 12 hr tablet Take 1 tablet (600 mg total) by mouth 2 (two) times daily. 05/23/21 07/22/21  Bonnielee Haff, MD  guaiFENesin-dextromethorphan [facility] DM) 100-10 MG/5ML syrup Take 5 mLs by mouth every 4 (four) hours as needed for cough. 05/23/21   Bonnielee Haff, MD  HYDROcodone-acetaminophen (NORCO/VICODIN) 5-325 MG tablet Take 1-2 tablets by mouth every 6 (six) hours as needed for moderate pain. 06/04/21   Hayden Pedro, PA-C  lidocaine-prilocaine (EMLA) cream Apply to affected area once 06/06/21   Benay Pike, MD  magnesium oxide (MAG-OX) 400 (240 Mg) MG tablet Take 1 tablet (400 mg total) by mouth 2 (two)  times daily. 05/23/21 06/22/21  Bonnielee Haff, MD  ondansetron (ZOFRAN) 8 MG tablet Take 1 tablet (8 mg total) by mouth 2 (two) times daily as needed for refractory nausea / vomiting. Start on day 3 after carboplatin chemo. 06/06/21   Benay Pike, MD  prochlorperazine (COMPAZINE) 10 MG tablet Take 1 tablet (10 mg total) by mouth every 6 (six) hours as needed (Nausea or vomiting). 06/06/21   Benay Pike, MD  senna-docusate (SENOKOT-S) 8.6-50 MG tablet Take 1 tablet by mouth at bedtime as needed for mild constipation. 05/23/21   Bonnielee Haff, MD  tiZANidine (ZANAFLEX) 4 MG capsule Take 1 capsule (4 mg total) by mouth 2 (two) times daily as needed for muscle spasms. 05/08/21   Raspet, Derry Skill, PA-C    Allergies    Patient has no known allergies.  Review of Systems   Review of Systems  Constitutional:  Positive for chills and fever. Negative for appetite change and fatigue.  Cardiovascular:  Positive for chest pain. Negative for palpitations and leg swelling.  Gastrointestinal:  Positive for abdominal pain.  Genitourinary:  Negative for dysuria.  Musculoskeletal:  Negative for back pain.  Skin:  Negative for rash.  Allergic/Immunologic: Negative for immunocompromised state.  Neurological:  Negative for dizziness, seizures, syncope, weakness, numbness and headaches.  Psychiatric/Behavioral:  Negative for confusion.    Physical Exam Updated Vital Signs BP 108/72   Pulse (!) 129   Temp 98.6 F (37 C) (Oral)   Resp (!) 23   Ht 5\' 9"  (1.753 m)   Wt 60 kg   SpO2 100%   BMI 19.53 kg/m   Physical Exam Vitals and nursing note reviewed.  Constitutional:      General: He is not in acute distress.    Appearance: He is well-developed. He is not ill-appearing, toxic-appearing or diaphoretic.     Comments: Chronically ill, cachectic male No acute distress.  Nasal cannula in place.  HENT:     Head: Normocephalic.  Eyes:     Conjunctiva/sclera: Conjunctivae normal.  Cardiovascular:     Rate and Rhythm: Normal rate and regular rhythm.     Heart sounds: No murmur heard. Pulmonary:     Effort: Pulmonary effort is normal.     Breath sounds: Rales present. No wheezing or rhonchi.     Comments: Absent breath sounds on the left.  Abdominal:     General: There is no distension.     Palpations: Abdomen is soft. There is no mass.     Tenderness: There is no abdominal tenderness.  There is no right CVA tenderness, left CVA tenderness, guarding or rebound.     Hernia: No hernia is present.  Musculoskeletal:        General: No tenderness.     Cervical back: Neck supple.     Right lower leg: No edema.     Left lower leg: No edema.  Skin:    General: Skin is warm and dry.     Capillary Refill: Capillary refill takes less than 2 seconds.     Coloration: Skin is not pale.     Findings: No rash.  Neurological:     General: No focal deficit present.     Mental Status: He is alert.     Comments: GCS 15.  Alert and oriented x3.  Psychiatric:        Behavior: Behavior normal.    ED Results / Procedures / Treatments   Labs (all labs ordered are listed, but only  abnormal results are displayed) Labs Reviewed  COMPREHENSIVE METABOLIC PANEL - Abnormal; Notable for the following components:      Result Value   Glucose, Bld 114 (*)    BUN 40 (*)    Calcium 8.6 (*)    Total Protein 6.4 (*)    Albumin 2.5 (*)    All other components within normal limits  LACTIC ACID, PLASMA - Abnormal; Notable for the following components:   Lactic Acid, Venous 6.9 (*)    All other components within normal limits  LACTIC ACID, PLASMA - Abnormal; Notable for the following components:   Lactic Acid, Venous 7.1 (*)    All other components within normal limits  CBC WITH DIFFERENTIAL/PLATELET - Abnormal; Notable for the following components:   WBC 3.6 (*)    RBC 3.51 (*)    Hemoglobin 10.2 (*)    HCT 32.6 (*)    RDW 15.7 (*)    Lymphs Abs 0.1 (*)    Monocytes Absolute 0.0 (*)    All other components within normal limits  BLOOD GAS, ARTERIAL - Abnormal; Notable for the following components:   pH, Arterial 7.140 (*)    pCO2 arterial 89.2 (*)    pO2, Arterial <31.0 (*)    Bicarbonate 29.1 (*)    All other components within normal limits  CBG MONITORING, ED - Abnormal; Notable for the following components:   Glucose-Capillary 137 (*)    All other components within normal limits   RESP PANEL BY RT-PCR (FLU A&B, COVID) ARPGX2  CULTURE, BLOOD (ROUTINE X 2)  CULTURE, BLOOD (ROUTINE X 2)  URINE CULTURE  RESPIRATORY PANEL BY PCR  PROTIME-INR  APTT  MAGNESIUM  PROCALCITONIN  URINALYSIS, ROUTINE W REFLEX MICROSCOPIC  STREP PNEUMONIAE URINARY ANTIGEN  LEGIONELLA PNEUMOPHILA SEROGP 1 UR AG  CBC  BASIC METABOLIC PANEL  BLOOD GAS, ARTERIAL  MAGNESIUM  PHOSPHORUS  HEMOGLOBIN A1C  TROPONIN I (HIGH SENSITIVITY)  TROPONIN I (HIGH SENSITIVITY)    EKG EKG Interpretation  Date/Time:  Monday June 10 2021 23:59:28 EDT Ventricular Rate:  119 PR Interval:  154 QRS Duration: 85 QT Interval:  339 QTC Calculation: 477 R Axis:   80 Text Interpretation: Sinus tachycardia Borderline prolonged QT interval Confirmed by Ripley Fraise [id number]) on 06/11/2021 1:12:20 AM  Radiology DG Chest 2 View  Result Date: 05/27/2021 CLINICAL DATA:  Suspected sepsis EXAM: CHEST - 2 VIEW COMPARISON:  05/21/2021.  CT 05/18/2021 FINDINGS: Near complete opacification of the left hemithorax. Only a small amount of aerated left upper lobe, which is improved since prior study. Right lung clear. Heart normal size. IMPRESSION: Near complete opacification of the left hemithorax with only a small amount of aerated left upper lobe. Electronically Signed   By: Rolm Baptise M.D.   On: 05/16/2021 22:40   DG Chest Portable 1 View  Result Date: 06/11/2021 CLINICAL DATA:  Shortness of breath EXAM: PORTABLE CHEST 1 VIEW COMPARISON:  06/03/2021 FINDINGS: Continued near complete opacification of the left hemithorax, not significantly changed. Probable increasing opacity in the medial right lung base. No right effusion. IMPRESSION: Continue near complete opacification of the left hemithorax. Suspect right infrahilar/medial basilar infiltrate. Electronically Signed   By: Rolm Baptise M.D.   On: 06/11/2021 01:42    Procedures .Critical Care  Date/Time: 06/11/2021 3:28 AM Performed by: Joanne Gavel,  PA-C Authorized by: Joanne Gavel, PA-C   Critical care provider statement:    Critical care time (minutes):  68   Critical care time was exclusive  of:  Separately billable procedures and treating other patients and teaching time   Critical care was necessary to treat or prevent imminent or life-threatening deterioration of the following conditions:  Respiratory failure   Critical care was time spent personally by me on the following activities:  Ordering and performing treatments and interventions, ordering and review of laboratory studies, ordering and review of radiographic studies, pulse oximetry, re-evaluation of patient's condition, review of old charts, obtaining history from patient or surrogate, examination of patient, evaluation of patient's response to treatment, development of treatment plan with patient or surrogate and discussions with consultants   I assumed direction of critical care for this patient from another provider in my specialty: no     Care discussed with: admitting provider     Medications Ordered in ED Medications  lactated ringers infusion ( Intravenous Stopped 06/11/21 0155)  docusate sodium (COLACE) capsule 100 mg (has no administration in time range)  polyethylene glycol (MIRALAX / GLYCOLAX) packet 17 g (has no administration in time range)  enoxaparin (LOVENOX) injection 30 mg (has no administration in time range)  dextrose 5 %-0.45 % sodium chloride infusion ( Intravenous New Bag/Given 06/11/21 0158)  pantoprazole (PROTONIX) injection 40 mg (40 mg Intravenous Given 06/11/21 0238)  insulin aspart (novoLOG) injection 0-9 Units (1 Units Subcutaneous Given 06/11/21 0243)  ceFEPIme (MAXIPIME) 2 g in sodium chloride 0.9 % 100 mL IVPB (has no administration in time range)  vancomycin (VANCOCIN) IVPB 1000 mg/200 mL premix (has no administration in time range)  lactated ringers bolus 1,000 mL (0 mLs Intravenous Stopped 06/11/21 0110)    And  lactated ringers bolus 1,000  mL (0 mLs Intravenous Stopped 06/11/21 0110)  acetaminophen (TYLENOL) tablet 650 mg (650 mg Oral Given 06/06/2021 2254)  ceFEPIme (MAXIPIME) 2 g in sodium chloride 0.9 % 100 mL IVPB (0 g Intravenous Stopped 06/11/21 0055)  vancomycin (VANCOCIN) IVPB 1000 mg/200 mL premix (0 mg Intravenous Stopped 06/11/21 0130)    ED Course  I have reviewed the triage vital signs and the nursing notes.  Pertinent labs & imaging results that were available during my care of the patient were reviewed by me and considered in my medical decision making (see chart for details).  Clinical Course as of 06/11/21 0347  Tue Jun 11, 2021  0133 Notified by RN that patient was now satting at 78% on a nonrebreather.  Reevaluated the patient.  Lungs sound wet bilaterally.  Repeat x-ray is pending.  Discussed the patient with Dr. Christy Gentles, attending physician.  We will trial patient on BiPAP.  ABG is pending.  Patient has no history of heart failure, but has no previous echo.  Question if patient is now volume overloaded from 30 cc/kg fluid bolus for septic shock.  Shortly after starting the patient on BiPAP, oxygen is 100%. [MM]  0134 Repeat lactate is 7.1.  Discussed CODE STATUS with family at bedside again.  Discussed that if BiPAP does not significantly improve the patient's respiratory status then he may require intubation.  After repeat discussion, patient remains a full code at this time.  Discussed with Dr. Chase Caller with PCCM.  Critical care will accept the patient for admission. [MM]    Clinical Course User Index [MM] Quadry Kampa, Laymond Purser, PA-C   MDM Rules/Calculators/A&P                           65 year old male with a history of with a history of metastatic non-small lung  cell to multiple lymph nodes, widespread lytic bone metastasis, and extensive involvement of the left lung extending into the left hemidiaphragm and into the left abdomen who presents to the emergency department with a 1 day history of fever and chills.   He has been having left chest pain for some time.  He denies shortness of breath, but was found to be acutely hypoxic with oxygen saturation in the upper 80s on arrival and was placed on 2 L nasal cannula.  He is febrile, tachycardic, and hypotensive on arrival.  Code sepsis initiated on arrival.  Patient was started on cefepime and vancomycin.  Blood cultures x2 obtained.  The patient was seen and independently evaluated by Dr. Christy Gentles, attending physician.  Patient is currently not hypoxic on 2 L nasal cannula and has no increased work of breathing.  However, after receiving 2 L of fluids, patient becomes acutely hypoxic with oxygen saturation 78%.  He was trialed on nonrebreather, but remained hypoxic and was ultimately requiring BiPAP.  Chest x-ray with near complete opacification of the left hemithorax.  There is a suspected right infrahilar basilar infiltrate.  Repeat chest x-ray intervally worse from previous.  Patient stable on BiPAP. We will continue to monitor.  ABG is pending.  Patient has leukopenia.  No left shift.  He does have vacuolated neutrophils.  Initial lactate is 6.9.  I initially discussed the patient with Dr. Montez Morita, critical care team, given concern for septic shock.  He recommends to check a second lactate and critical care team will consider the patient for admission if lactate is not improving.  No metabolic derangements.  EKG is otherwise reassuring.  Delta troponin is not elevated.  After receiving multiple liters of fluids, repeat lactate is 7.1.  Critical care team will accept the patient for admission.  Following fluid resuscitation, hypotension has resolved.  Regarding patient's fever, COVID-19 test is negative.  The patient does have a developing infiltrates on the right.  However, family has reported that the patient has had urinary symptoms over the last few days.  Urinalysis is pending.   While the patient was in the ED, multiple conversations were had with  the patient and his sister about CODE STATUS.  At this time, the patient will remain a full code. The patient appears reasonably stabilized for admission considering the current resources, flow, and capabilities available in the ED at this time, and I doubt any other [facility] requiring further screening and/or treatment in the ED prior to admission.   Final Clinical Impression(s) / ED Diagnoses Final diagnoses:  Sepsis with acute hypoxic respiratory failure and septic shock, due to unspecified organism [facility])    Rx / DC Orders ED Discharge Orders     None        Joanne Gavel, PA-C 06/11/21 0347    Ripley Fraise, MD 06/11/21 [phone]

## 2021-06-11 ENCOUNTER — Emergency Department (HOSPITAL_COMMUNITY): Payer: Medicaid Other

## 2021-06-11 ENCOUNTER — Ambulatory Visit: Payer: Medicaid Other

## 2021-06-11 ENCOUNTER — Inpatient Hospital Stay (HOSPITAL_COMMUNITY): Payer: Medicaid Other

## 2021-06-11 ENCOUNTER — Other Ambulatory Visit: Payer: Self-pay | Admitting: Radiology

## 2021-06-11 DIAGNOSIS — A403 Sepsis due to Streptococcus pneumoniae: Secondary | ICD-10-CM | POA: Diagnosis present

## 2021-06-11 DIAGNOSIS — E878 Other disorders of electrolyte and fluid balance, not elsewhere classified: Secondary | ICD-10-CM | POA: Diagnosis present

## 2021-06-11 DIAGNOSIS — E43 Unspecified severe protein-calorie malnutrition: Secondary | ICD-10-CM | POA: Diagnosis present

## 2021-06-11 DIAGNOSIS — J9601 Acute respiratory failure with hypoxia: Secondary | ICD-10-CM

## 2021-06-11 DIAGNOSIS — J9621 Acute and chronic respiratory failure with hypoxia: Secondary | ICD-10-CM | POA: Diagnosis present

## 2021-06-11 DIAGNOSIS — J13 Pneumonia due to Streptococcus pneumoniae: Secondary | ICD-10-CM | POA: Diagnosis present

## 2021-06-11 DIAGNOSIS — N179 Acute kidney failure, unspecified: Secondary | ICD-10-CM

## 2021-06-11 DIAGNOSIS — R7303 Prediabetes: Secondary | ICD-10-CM | POA: Diagnosis present

## 2021-06-11 DIAGNOSIS — Z20822 Contact with and (suspected) exposure to covid-19: Secondary | ICD-10-CM | POA: Diagnosis present

## 2021-06-11 DIAGNOSIS — J9620 Acute and chronic respiratory failure, unspecified whether with hypoxia or hypercapnia: Secondary | ICD-10-CM | POA: Diagnosis present

## 2021-06-11 DIAGNOSIS — A419 Sepsis, unspecified organism: Secondary | ICD-10-CM

## 2021-06-11 DIAGNOSIS — Z515 Encounter for palliative care: Secondary | ICD-10-CM | POA: Diagnosis not present

## 2021-06-11 DIAGNOSIS — D630 Anemia in neoplastic disease: Secondary | ICD-10-CM | POA: Diagnosis present

## 2021-06-11 DIAGNOSIS — E872 Acidosis: Secondary | ICD-10-CM

## 2021-06-11 DIAGNOSIS — D61818 Other pancytopenia: Secondary | ICD-10-CM | POA: Diagnosis present

## 2021-06-11 DIAGNOSIS — C7970 Secondary malignant neoplasm of unspecified adrenal gland: Secondary | ICD-10-CM | POA: Diagnosis present

## 2021-06-11 DIAGNOSIS — C772 Secondary and unspecified malignant neoplasm of intra-abdominal lymph nodes: Secondary | ICD-10-CM | POA: Diagnosis present

## 2021-06-11 DIAGNOSIS — J9622 Acute and chronic respiratory failure with hypercapnia: Secondary | ICD-10-CM

## 2021-06-11 DIAGNOSIS — Z681 Body mass index (BMI) 19 or less, adult: Secondary | ICD-10-CM | POA: Diagnosis not present

## 2021-06-11 DIAGNOSIS — R6521 Severe sepsis with septic shock: Secondary | ICD-10-CM | POA: Diagnosis present

## 2021-06-11 DIAGNOSIS — R64 Cachexia: Secondary | ICD-10-CM | POA: Diagnosis present

## 2021-06-11 DIAGNOSIS — Z66 Do not resuscitate: Secondary | ICD-10-CM | POA: Diagnosis not present

## 2021-06-11 DIAGNOSIS — B964 Proteus (mirabilis) (morganii) as the cause of diseases classified elsewhere: Secondary | ICD-10-CM | POA: Diagnosis present

## 2021-06-11 DIAGNOSIS — C7951 Secondary malignant neoplasm of bone: Secondary | ICD-10-CM | POA: Diagnosis present

## 2021-06-11 DIAGNOSIS — G893 Neoplasm related pain (acute) (chronic): Secondary | ICD-10-CM | POA: Diagnosis present

## 2021-06-11 DIAGNOSIS — C3490 Malignant neoplasm of unspecified part of unspecified bronchus or lung: Secondary | ICD-10-CM

## 2021-06-11 DIAGNOSIS — C3492 Malignant neoplasm of unspecified part of left bronchus or lung: Secondary | ICD-10-CM | POA: Diagnosis present

## 2021-06-11 DIAGNOSIS — Z800 Family history of malignant neoplasm of digestive organs: Secondary | ICD-10-CM | POA: Diagnosis not present

## 2021-06-11 LAB — BLOOD CULTURE ID PANEL (REFLEXED) - BCID2
A.calcoaceticus-baumannii: NOT DETECTED
Bacteroides fragilis: NOT DETECTED
CTX-M ESBL: NOT DETECTED
Candida albicans: NOT DETECTED
Candida auris: NOT DETECTED
Candida glabrata: NOT DETECTED
Candida krusei: NOT DETECTED
Candida parapsilosis: NOT DETECTED
Candida tropicalis: NOT DETECTED
Carbapenem resist OXA 48 LIKE: NOT DETECTED
Carbapenem resistance IMP: NOT DETECTED
Carbapenem resistance KPC: NOT DETECTED
Carbapenem resistance NDM: NOT DETECTED
Carbapenem resistance VIM: NOT DETECTED
Cryptococcus neoformans/gattii: NOT DETECTED
Enterobacter cloacae complex: NOT DETECTED
Enterobacterales: DETECTED — AB
Enterococcus Faecium: NOT DETECTED
Enterococcus faecalis: NOT DETECTED
Escherichia coli: NOT DETECTED
Haemophilus influenzae: NOT DETECTED
Klebsiella aerogenes: NOT DETECTED
Klebsiella oxytoca: NOT DETECTED
Klebsiella pneumoniae: NOT DETECTED
Listeria monocytogenes: NOT DETECTED
Neisseria meningitidis: NOT DETECTED
Proteus species: NOT DETECTED
Pseudomonas aeruginosa: NOT DETECTED
Salmonella species: NOT DETECTED
Serratia marcescens: NOT DETECTED
Staphylococcus aureus (BCID): NOT DETECTED
Staphylococcus epidermidis: NOT DETECTED
Staphylococcus lugdunensis: NOT DETECTED
Staphylococcus species: NOT DETECTED
Stenotrophomonas maltophilia: NOT DETECTED
Streptococcus agalactiae: NOT DETECTED
Streptococcus pneumoniae: DETECTED — AB
Streptococcus pyogenes: NOT DETECTED
Streptococcus species: DETECTED — AB

## 2021-06-11 LAB — BLOOD GAS, ARTERIAL
Acid-Base Excess: 1.6 mmol/L (ref 0.0–2.0)
Acid-base deficit: 1.1 mmol/L (ref 0.0–2.0)
Bicarbonate: 29.1 mmol/L — ABNORMAL HIGH (ref 20.0–28.0)
Bicarbonate: 26.7 mmol/L (ref 20.0–28.0)
FIO2: 100
FIO2: 100
O2 Saturation: 12.3 %
O2 Saturation: 99.3 %
Patient temperature: 98.6
Patient temperature: 99.6
pCO2 arterial: 89.2 mmHg (ref 32.0–48.0)
pCO2 arterial: 48.6 mmHg — ABNORMAL HIGH (ref 32.0–48.0)
pH, Arterial: 7.14 — CL (ref 7.350–7.450)
pH, Arterial: 7.362 (ref 7.350–7.450)
pO2, Arterial: <31 mmHg — CL (ref 83.0–108.0)
pO2, Arterial: 400 mmHg — ABNORMAL HIGH (ref 83.0–108.0)

## 2021-06-11 LAB — GLUCOSE, CAPILLARY
Glucose-Capillary: 93 mg/dL (ref 70–99)
Glucose-Capillary: 119 mg/dL — ABNORMAL HIGH (ref 70–99)
Glucose-Capillary: 99 mg/dL (ref 70–99)

## 2021-06-11 LAB — CBC
HCT: 27.9 % — ABNORMAL LOW (ref 39.0–52.0)
Hemoglobin: 8.7 g/dL — ABNORMAL LOW (ref 13.0–17.0)
MCH: 29 pg (ref 26.0–34.0)
MCHC: 31.2 g/dL (ref 30.0–36.0)
MCV: 93 fL (ref 80.0–100.0)
Platelets: 148 10*3/uL — ABNORMAL LOW (ref 150–400)
RBC: 3 MIL/uL — ABNORMAL LOW (ref 4.22–5.81)
RDW: 15.7 % — ABNORMAL HIGH (ref 11.5–15.5)
WBC: 2.7 10*3/uL — ABNORMAL LOW (ref 4.0–10.5)
nRBC: 0 % (ref 0.0–0.2)

## 2021-06-11 LAB — URINALYSIS, ROUTINE W REFLEX MICROSCOPIC
Bacteria, UA: NONE SEEN
Bilirubin Urine: NEGATIVE
Glucose, UA: NEGATIVE mg/dL
Hgb urine dipstick: NEGATIVE
Ketones, ur: NEGATIVE mg/dL
Leukocytes,Ua: NEGATIVE
Nitrite: NEGATIVE
Protein, ur: 30 mg/dL — AB
Specific Gravity, Urine: 1.021 (ref 1.005–1.030)
pH: 5 (ref 5.0–8.0)

## 2021-06-11 LAB — RESPIRATORY PANEL BY PCR

## 2021-06-11 LAB — BASIC METABOLIC PANEL
Anion gap: 10 (ref 5–15)
BUN: 38 mg/dL — ABNORMAL HIGH (ref 8–23)
CO2: 29 mmol/L (ref 22–32)
Calcium: 8.2 mg/dL — ABNORMAL LOW (ref 8.9–10.3)
Chloride: 102 mmol/L (ref 98–111)
Creatinine, Ser: 0.76 mg/dL (ref 0.61–1.24)
GFR, Estimated: >60 mL/min (ref >60)
Glucose, Bld: 105 mg/dL — ABNORMAL HIGH (ref 70–99)
Potassium: 4.1 mmol/L (ref 3.5–5.1)
Sodium: 141 mmol/L (ref 135–145)

## 2021-06-11 LAB — PHOSPHORUS: Phosphorus: 2.7 mg/dL (ref 2.5–4.6)

## 2021-06-11 LAB — MAGNESIUM: Magnesium: 2 mg/dL (ref 1.7–2.4)

## 2021-06-11 LAB — LACTIC ACID, PLASMA
Lactic Acid, Venous: 7.1 mmol/L (ref 0.5–1.9)
Lactic Acid, Venous: 5.1 mmol/L (ref 0.5–1.9)

## 2021-06-11 LAB — STREP PNEUMONIAE URINARY ANTIGEN: Strep Pneumo Urinary Antigen: NEGATIVE

## 2021-06-11 LAB — TROPONIN I (HIGH SENSITIVITY)
Troponin I (High Sensitivity): 13 ng/L (ref <18)
Troponin I (High Sensitivity): 14 ng/L (ref <18)

## 2021-06-11 LAB — PROCALCITONIN: Procalcitonin: 77.28 ng/mL

## 2021-06-11 LAB — CBG MONITORING, ED
Glucose-Capillary: 137 mg/dL — ABNORMAL HIGH (ref 70–99)
Glucose-Capillary: 102 mg/dL — ABNORMAL HIGH (ref 70–99)

## 2021-06-11 LAB — MRSA PCR SCREENING: MRSA by PCR: NEGATIVE

## 2021-06-11 MED ORDER — POLYETHYLENE GLYCOL 3350 17 G PO PACK: 17.0000 g | PACK | Freq: Every day | ORAL | Status: DC | PRN

## 2021-06-11 MED ORDER — GLYCOPYRROLATE 1 MG PO TABS: 1.0000 mg | ORAL_TABLET | ORAL | Status: DC | PRN

## 2021-06-11 MED ORDER — ENOXAPARIN SODIUM 30 MG/0.3ML IJ SOSY
30.0000 mg | PREFILLED_SYRINGE | INTRAMUSCULAR | Status: DC
  Filled 2021-06-11: qty 0.3

## 2021-06-11 MED ORDER — MORPHINE SULFATE (PF) 2 MG/ML IV SOLN
2.0000 mg | INTRAVENOUS | Status: DC | PRN
  Administered 2021-06-11: 2 mg via INTRAVENOUS
  Administered 2021-06-12 (×2): 4 mg via INTRAVENOUS
  Administered 2021-06-12 (×2): 2 mg via INTRAVENOUS
  Filled 2021-06-11 (×4): qty 1
  Filled 2021-06-11: qty 2
  Filled 2021-06-11: qty 1

## 2021-06-11 MED ORDER — LORAZEPAM 2 MG/ML IJ SOLN
1.0000 mg | INTRAMUSCULAR | Status: DC
  Administered 2021-06-11 – 2021-06-12 (×4): 1 mg via INTRAVENOUS
  Filled 2021-06-11 (×4): qty 1

## 2021-06-11 MED ORDER — DIPHENHYDRAMINE HCL 50 MG/ML IJ SOLN
25.0000 mg | INTRAMUSCULAR | Status: DC | PRN
Start: 2021-06-11 — End: 2021-06-12

## 2021-06-11 MED ORDER — DEXTROSE-NACL 5-0.45 % IV SOLN: INTRAVENOUS | Status: DC

## 2021-06-11 MED ORDER — SODIUM CHLORIDE 0.9 % IV SOLN
2.0000 g | Freq: Three times a day (TID) | INTRAVENOUS | Status: DC
  Administered 2021-06-11: 2 g via INTRAVENOUS
  Filled 2021-06-11: qty 2

## 2021-06-11 MED ORDER — DEXTROSE 5 % IV SOLN: INTRAVENOUS | Status: DC

## 2021-06-11 MED ORDER — ACETAMINOPHEN 650 MG RE SUPP
650.0000 mg | Freq: Once | RECTAL | Status: AC
  Administered 2021-06-11: 650 mg via RECTAL
  Filled 2021-06-11: qty 1

## 2021-06-11 MED ORDER — NOREPINEPHRINE 4 MG/250ML-% IV SOLN
4.0000 ug/min | INTRAVENOUS | Status: DC
  Administered 2021-06-11: 4 ug/min via INTRAVENOUS
  Filled 2021-06-11: qty 250

## 2021-06-11 MED ORDER — LACTATED RINGERS IV BOLUS
500.0000 mL | Freq: Once | INTRAVENOUS | Status: AC
  Administered 2021-06-11: 500 mL via INTRAVENOUS

## 2021-06-11 MED ORDER — ENOXAPARIN SODIUM 60 MG/0.6ML IJ SOSY
1.0000 mg/kg | PREFILLED_SYRINGE | Freq: Two times a day (BID) | INTRAMUSCULAR | Status: DC
  Filled 2021-06-11: qty 0.6

## 2021-06-11 MED ORDER — CHLORHEXIDINE GLUCONATE CLOTH 2 % EX PADS: 6.0000 | MEDICATED_PAD | Freq: Every day | CUTANEOUS | Status: DC

## 2021-06-11 MED ORDER — GLYCOPYRROLATE 0.2 MG/ML IJ SOLN
0.2000 mg | INTRAMUSCULAR | Status: DC | PRN
  Administered 2021-06-11 – 2021-06-12 (×2): 0.2 mg via INTRAVENOUS
  Filled 2021-06-11 (×2): qty 1

## 2021-06-11 MED ORDER — MORPHINE SULFATE (PF) 2 MG/ML IV SOLN
2.0000 mg | INTRAVENOUS | Status: DC | PRN
  Administered 2021-06-11 (×4): 2 mg via INTRAVENOUS
  Filled 2021-06-11 (×4): qty 1

## 2021-06-11 MED ORDER — POLYVINYL ALCOHOL 1.4 % OP SOLN
1.0000 [drp] | Freq: Four times a day (QID) | OPHTHALMIC | Status: DC | PRN
  Filled 2021-06-11: qty 15

## 2021-06-11 MED ORDER — DOCUSATE SODIUM 100 MG PO CAPS: 100.0000 mg | ORAL_CAPSULE | Freq: Two times a day (BID) | ORAL | Status: DC | PRN

## 2021-06-11 MED ORDER — PANTOPRAZOLE SODIUM 40 MG IV SOLR
40.0000 mg | Freq: Every day | INTRAVENOUS | Status: DC
  Administered 2021-06-11: 40 mg via INTRAVENOUS
  Filled 2021-06-11: qty 40

## 2021-06-11 MED ORDER — INSULIN ASPART 100 UNIT/ML IJ SOLN
0.0000 [IU] | INTRAMUSCULAR | Status: DC
  Administered 2021-06-11: 1 [IU] via SUBCUTANEOUS
  Filled 2021-06-11: qty 0.09

## 2021-06-11 MED ORDER — ACETAMINOPHEN 325 MG PO TABS: 650.0000 mg | ORAL_TABLET | Freq: Four times a day (QID) | ORAL | Status: DC | PRN

## 2021-06-11 MED ORDER — VANCOMYCIN HCL IN DEXTROSE 1-5 GM/200ML-% IV SOLN: 1000.0000 mg | INTRAVENOUS | Status: DC

## 2021-06-11 MED ORDER — LORAZEPAM 2 MG/ML IJ SOLN
2.0000 mg | INTRAMUSCULAR | Status: DC | PRN
  Administered 2021-06-12: 2 mg via INTRAVENOUS
  Filled 2021-06-11: qty 1

## 2021-06-11 MED ORDER — SODIUM CHLORIDE 0.9 % IV SOLN: 2.0000 g | Freq: Two times a day (BID) | INTRAVENOUS | Status: DC

## 2021-06-11 MED ORDER — GLYCOPYRROLATE 0.2 MG/ML IJ SOLN: 0.2000 mg | INTRAMUSCULAR | Status: DC | PRN

## 2021-06-11 MED ORDER — MORPHINE SULFATE (PF) 2 MG/ML IV SOLN: 2.0000 mg | Freq: Once | INTRAVENOUS | Status: DC | PRN

## 2021-06-11 NOTE — Progress Notes (Signed)
Chaplain responded to page for patient and family support as patient moving to comfort measures.  2 sisters at bedside, son apparently is not able to be present, too hard to see his father this way.  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  Chaplain allowed space for family to share memories.  RN played music and chaplain provided wash cloth and hand lotion for family to comfort the patient.  Chaplain offered prayer as family expressed faith is important to them all.  Chaplain available as needed for support. Chaplain Katherene Ponto, Mdiv.     06/11/21 1826  Clinical Encounter Type  Visited With Patient and family together;Health care provider  Visit Type Patient actively dying  Referral From Nurse  Consult/Referral To Chaplain  Spiritual Encounters  Spiritual Needs Prayer;Grief support

## 2021-06-11 NOTE — Progress Notes (Signed)
We were made aware by the treatment staff that the patient is inpt at [facility]. It appears he has had progressive respiratory concerns and has used BiPAP but is now on 2L [place]. Reviewed notes from other providers but I also called pt and he desires that we continue with palliative radiotherapy. Provided he is safe for transport to our dept we will continue with palliative XRT to the chest, T spine and Pelvis.    Carola Rhine, PAC

## 2021-06-11 NOTE — Progress Notes (Signed)
A consult was received from an ED physician for vancomycin and cefepime per pharmacy dosing.  The patient's profile has been reviewed for ht/wt/allergies/indication/available labs.   A one time order has been placed for vancomycin 1gm and cefepime 2gm   Further antibiotics/pharmacy consults should be ordered by admitting physician if indicated.                       Thank you, Dolly Rias RPh 06/11/2021, 12:14 AM

## 2021-06-11 NOTE — Progress Notes (Signed)
PHARMACY NOTE:  ANTIMICROBIAL RENAL DOSAGE ADJUSTMENT Current antimicrobial dosage:  Cefepime 2 gm q12  Indication: HAP  Renal Function: Estimated Creatinine Clearance: 79.2 mL/min (by C-G formula based on SCr of 0.76 mg/dL).   Antimicrobial dosage has been changed to:  Cefepime 2 gm q8h  Thank you for allowing pharmacy to be a part of this patient's care.  Eudelia Bunch, Pharm.D 06/11/2021 9:45 AM

## 2021-06-11 NOTE — Progress Notes (Signed)
Bilateral lower extremity venous duplex has been completed. Preliminary results can be found in CV Proc through chart review.   06/11/21 9:30 AM Carlos Levering RVT

## 2021-06-11 NOTE — Progress Notes (Signed)
Transported on BiPAP to ICU without incident.

## 2021-06-11 NOTE — Sepsis Progress Note (Signed)
Code Sepsis Follow Up Note:  Spoke with bedside RN in ICU and Elink MD was entering another order to have the LA repeated.   Ma Munoz [place] RN 0600 AM

## 2021-06-11 NOTE — Progress Notes (Addendum)
I spoke to Mr. Thier this morning regarding his cancer and his prognosis. He understands that this will be terminal, likely in the next few days. He understands that resuscitative measures would not reverse this progression and does not want intubation or resuscitation. RN present during discussion I spoke to his sister Dewaine Oats on the phone and she agrees. We will liberalize his visitation and focus on controlling symptoms at this point. Dewaine Oats will be here later this morning and his brother, sister-in-law, and son will likely be coming later. Code status updated in epic and RN aware.  Julian Hy, DO 06/11/21 8:15 AM [facility]

## 2021-06-11 NOTE — Progress Notes (Signed)
Pt seen, asleep, transitioned to comfort care today per RN.  HR116, RR23, SPO2 97% on 2L [place].  Bipap not indicated at this time.

## 2021-06-11 NOTE — ED Notes (Addendum)
Pt presents more lethargic, diaphoretic, increased labored breathing, tachycardiac, and o2sat decreased to 60s on 4L[place]. Pt placed on 15L [facility] and PA Mia made aware of pts change in status.

## 2021-06-11 NOTE — Consult Note (Signed)
Consultation Note Date: 06/11/2021   Patient Name: Bobby Pope  DOB: December 30, 1955  MRN: [id number]  Age / Sex: 65 y.o., male  PCP: Pcp, No Referring Physician: Julian Hy, DO  Reason for Consultation: Establishing goals of care  HPI/Patient Profile: 65 y.o. 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  His respiratory status has been tenuous and he has been intermittently requiring BiPAP.  Palliative consulted for goals of care.  Clinical Assessment and Goals of Care: I met today with Bobby Pope.  I introduced palliative care as specialized medical care for people living with serious illness. It focuses on providing relief from the symptoms and stress of a serious illness. The goal is to improve quality of life for both the patient and the family.  He tells me that he understands he has incurable cancer and he understands the goal of interventions is to add as much time and quality to his life as possible.  He tells me the most important things to him are his family, feeling as well as he can for as long as he can, and coaching baseball.  His family consists of 2 sisters, a son, and granddaughter.  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  We discussed clinical course as well as wishes moving forward in regard to advanced directives.  Concepts specific to code status and care plan this hospitalization discussed.  We discussed difference  between a aggressive medical intervention path and a palliative, comfort focused care path.  We spent a long time discussing the difference between a comfort based approach to care and placing a limit of do not attempt resuscitation in the event of a natural death.  He is clear at this point that he would want to continue with interventions that have a possibility of prolong his life, however, he also tells me that he is okay when it is "my time."  We talked about what this meant to him, and he indicated that his limit of care would be no CPR or mechanical ventilation as these would not be a likely pathway towards getting well enough to be back out of the hospital spending time doing things that are important to him.  He tells me he is in agreement with plan for DNR/DNI but he will not consent to wear a DNR bracelet as this is just a constant reminder to him staring him in the face that he is approaching end of his life.  We discussed plan to continue with current interventions and reassess his situation again tomorrow.  He asked me if I thought he would  be here tomorrow, and I expressed that I am worried that it is a very realistic possibility that he may die in the next couple of days.  He also inquired about radiation today.  Discussed with Dr. Carlis Abbott and with his tenuous respiratory status, I agree with recommendation to forego radiation today.  If he does stabilize, we discussed plan to reassess for potential for radiation tomorrow.  Questions and concerns addressed.   PMT will continue to support holistically.  SUMMARY OF RECOMMENDATIONS   -DNR/DNI -He remains critically ill we discussed this today.  I am concerned that he may decompensate despite best medical interventions.  We discussed clearly the limit of care as no CPR or intubation but he is open to continuation of other interventions up to that point in time. -Continue current care.  Would defer radiation today and will reassess for potential  radiation tomorrow. -Palliative care to continue to follow.  Code Status/Advance Care Planning: DNR   Psycho-social/Spiritual:  Desire for further Chaplaincy support:Not addressed today Additional Recommendations: Caregiving  Support/Resources  Prognosis:  Guarded  Discharge Planning: To Be Determined      Primary Diagnoses: Present on Admission:  Acute and chronic respiratory failure (acute-on-chronic) ([place])   I have reviewed the medical record, interviewed the patient and family, and examined the patient. The following aspects are pertinent.  History reviewed. No pertinent past medical history. Social History   Socioeconomic History   Marital status: Single    Spouse name: Not on file   Number of children: Not on file   Years of education: Not on file   Highest education level: Not on file  Occupational History   Not on file  Tobacco Use   Smoking status: Former    Packs/day: 0.25    Pack years: 0.00    Types: Cigarettes   Smokeless tobacco: Never  Substance and Sexual Activity   Alcohol use: Yes    Comment: occasional, denies hx of withdrawal    Drug use: Not on file   Sexual activity: Not on file  Other Topics Concern   Not on file  Social History Narrative   Not on file   Social Determinants of Health   Financial Resource Strain: Not on file  Food Insecurity: Not on file  Transportation Needs: Not on file  Physical Activity: Not on file  Stress: Not on file  Social Connections: Not on file   Family History  Problem Relation Age of Onset   Colon cancer Mother    Scheduled Meds:  [START ON 06-23-21] Chlorhexidine Gluconate Cloth  6 each Topical Q0600   enoxaparin (LOVENOX) injection  1 mg/kg Subcutaneous Q12H   insulin aspart  0-9 Units Subcutaneous Q4H   LORazepam  1 mg Intravenous Q4H   pantoprazole (PROTONIX) IV  40 mg Intravenous QHS   Continuous Infusions:  ceFEPime (MAXIPIME) IV Stopped (06/11/21 1219)   dextrose 5 % and 0.45% NaCl  50 mL/hr at 06/11/21 0158   lactated ringers Stopped (06/11/21 0110)   [START ON 23-Jun-2021] vancomycin     PRN Meds:.docusate sodium, morphine injection, polyethylene glycol Medications Prior to Admission:  Prior to Admission medications   Medication Sig Start Date End Date Taking? Authorizing Provider  dexamethasone (DECADRON) 4 MG tablet Take 2 tablets (8 mg total) by mouth daily. Start the day after carboplatin chemotherapy for 3 days. 06/06/21  Yes Benay Pike, MD  docusate sodium (COLACE) 100 MG capsule Take 100 mg by mouth at bedtime.   Yes [provider]  guaiFENesin (MUCINEX) 600 MG 12 hr tablet Take 1 tablet (600 mg total) by mouth 2 (two) times daily. 05/23/21 07/22/21 Yes Bonnielee Haff, MD  guaiFENesin-dextromethorphan [facility] DM) 100-10 MG/5ML syrup Take 5 mLs by mouth every 4 (four) hours as needed for cough. 05/23/21  Yes Bonnielee Haff, MD  HYDROcodone-acetaminophen (NORCO/VICODIN) 5-325 MG tablet Take 1-2 tablets by mouth every 6 (six) hours as needed for moderate pain. 06/04/21  Yes Hayden Pedro, PA-C  lidocaine-prilocaine (EMLA) cream Apply to affected area once Patient taking differently: Apply 1 application topically as needed. Prior to portacath access 06/06/21  Yes Iruku, Arletha Pili, MD  magnesium oxide (MAG-OX) 400 (240 Mg) MG tablet Take 1 tablet (400 mg total) by mouth 2 (two) times daily. 05/23/21 06/22/21 Yes Bonnielee Haff, MD  ondansetron (ZOFRAN) 8 MG tablet Take 1 tablet (8 mg total) by mouth 2 (two) times daily as needed for refractory nausea / vomiting. Start on day 3 after carboplatin chemo. 06/06/21  Yes Benay Pike, MD  prochlorperazine (COMPAZINE) 10 MG tablet Take 1 tablet (10 mg total) by mouth every 6 (six) hours as needed (Nausea or vomiting). 06/06/21  Yes Iruku, Arletha Pili, MD  senna-docusate (SENOKOT-S) 8.6-50 MG tablet Take 1 tablet by mouth at bedtime as needed for mild constipation. 05/23/21  Yes Bonnielee Haff, MD  tiZANidine  (ZANAFLEX) 4 MG capsule Take 1 capsule (4 mg total) by mouth 2 (two) times daily as needed for muscle spasms. 05/08/21  Yes Raspet, Erin K, PA-C   No Known Allergies Review of Systems  Constitutional:  Positive for activity change and fatigue.  Respiratory:  Positive for cough and shortness of breath.   Neurological:  Positive for weakness.   Physical Exam General: Alert, awake, in no acute distress.  Chronically ill appearing and frail. HEENT: No bruits, no goiter, no JVD, poor dentition Heart: Tachycardic. No murmur appreciated. Lungs: Dminiished on L, coars on R Abdomen: Soft, nontender, nondistended, positive bowel sounds.   Ext: No significant edema Skin: Warm and dry Neuro: Grossly intact, nonfocal.   Vital Signs: BP 105/76 (BP Location: Right Arm)   Pulse (!) 120   Temp (!) 97.3 F (36.3 C) (Axillary)   Resp (!) 24   Ht _0  (1.753 m)   Wt 60 kg   SpO2 99%   BMI 19.53 kg/m  Pain Scale: 0-10   Pain Score: 0-No pain   SpO2: SpO2: 99 % O2 Device:SpO2: 99 % O2 Flow Rate: .O2 Flow Rate (L/min): 2 L/min  IO: Intake/output summary:  Intake/Output Summary (Last 24 hours) at 06/11/2021 1245 Last data filed at 06/11/2021 0436 Gross per 24 hour  Intake 2300 ml  Output 501 ml  Net 1799 ml    LBM: Last BM Date:  (PTA) Baseline Weight: Weight: 60 kg Most recent weight: Weight: 60 kg     Palliative Assessment/Data:   Flowsheet Rows    Flowsheet Row Most Recent Value  Intake Tab   Referral Department Critical care  Unit at Time of Referral ICU  Palliative Care Primary Diagnosis Sepsis/Infectious Disease  Date Notified 06/11/21  Palliative Care Type New Palliative care  Reason for referral Clarify Goals of Care  Date of Admission 05/17/2021  Date first seen by Palliative Care 06/11/21  # of days Palliative referral response time 0 Day(s)  # of days IP prior to Palliative referral 1  Clinical Assessment   Palliative Performance Scale Score 30%  Psychosocial &  Spiritual Assessment   Palliative Care Outcomes  Patient/Family meeting held? Yes  Who was at the meeting? Patient       Time In: 0855 Time Out: 1010 Time Total: 75 Greater than 50%  of this time was spent counseling and coordinating care related to the above assessment and plan.  Signed by: Micheline Rough, MD   Please contact Palliative Medicine Team phone at [phone] for questions and concerns.  For individual provider: See Shea Evans

## 2021-06-11 NOTE — Progress Notes (Signed)
Pharmacy Antibiotic Note  Bobby Pope is a 65 y.o. male admitted on 05/26/2021 with AKI along with with severe lactic acidosis and acute hypoxic respiratory failure.  Pt has recent diagnosis of metastatic squamous cell carcinoma of the lung.  Pharmacy has been consulted to dose cefepime and vancomycin for HCAP.  Plan: Vancomycin 1000mg  q24h (AUC 461.9, Scr 1.13, TBW) Cefepime 2gm q12h Follow renal function, cultures and clinical course     Temp (24hrs), Avg:100.6 F (38.1 C), Min:98.6 F (37 C), Max:102.5 F (39.2 C)  Recent Labs  Lab 05/29/2021 2245 06/11/21 0022  WBC 3.6*  --   CREATININE 1.13  --   LATICACIDVEN 6.9* 7.1*    Estimated Creatinine Clearance: 55.6 mL/min (by C-G formula based on SCr of 1.13 mg/dL).    No Known Allergies  Antimicrobials this admission 6/28 vanc >> 6/28 cefepime >>  Dose adjustments this admission:   Microbiology results: 6/27 BCx:    Thank you for allowing pharmacy to be a part of this patient's care.  Dolly Rias RPh 06/11/2021, 2:39 AM

## 2021-06-11 NOTE — Consult Note (Addendum)
NAME:  Bobby Pope, MRN:  [id number], DOB:  December 08, 1956, LOS: 0 ADMISSION DATE:  05/24/2021, CONSULTATION DATE:  06/11/21 REFERRING MD:  Elvina [facility]le ER App  [name], CHIEF COMPLAINT:  lactic acidosis and concern for sepsi   History of Present Illness: from patient, sister at bedside, and ER APP and record review   65 year old male patient of Dr. Benay Pike [place] oncologist.  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  He also has adrenal metastasis and retroperitoneal lesion his PD-L1 is 10% without any targeted mutation.  He started palliative radiation therapy.  Combination carboplatin, paclitaxel and Beryle Flock is pending.  He also has cancer-related pain and severe cancer cachexia with weight loss.  He and his sister were clear understanding that the intent is palliative and is to help symptom control.  They clearly understand his cancer is not curative and is in fact widespread.  His most recent radiation was on 05/31/2021 and at home started having epistaxis and coughing and slight increase in lethargy along with fever and chills at home.  Fever 102.4 at admit. He had acute kidney injury creatinine 1.13 mg percent at admission along with severe lactic acidosis 6.9 mg percent and acute hypoxic respiratory failure requiring 2 L nasal cannula (new) with chest x-ray showing severe left whiteout that is baseline.  Blood pressure was soft but with fluid resuscitation it normalized.  Critical care medicine consulted by ER  Port-A-Cath and chemotherapy are pending.  Sister feels he is currently unstable for those.  Pertinent  Medical History    has no past medical history on file.   reports that he has quit smoking. His smoking use included cigarettes. He  smoked an average of 0.25 packs per day. He has never used smokeless tobacco.  History reviewed. No pertinent surgical history.  No Known Allergies   There is no immunization history on file for this patient.  Family History  Problem Relation Age of Onset   Colon cancer Mother      Current Facility-Administered Medications:    lactated ringers infusion, , Intravenous, Continuous, McDonald, Mia A, PA-C, Last Rate: 150 mL/hr at 05/22/2021 2256, New Bag at 05/15/2021 2256   vancomycin (VANCOCIN) IVPB 1000 mg/200 mL premix, 1,000 mg, Intravenous, Once, Angela Adam, [facility], Last Rate: 200 mL/hr at 06/11/21 0028, 1,000 mg at 06/11/21 0028  Current Outpatient Medications:    dexamethasone (DECADRON) 4 MG tablet, Take 2 tablets (8 mg total) by mouth daily. Start the day after carboplatin chemotherapy for 3 days., Disp: 30 tablet, Rfl: 1   guaiFENesin (MUCINEX) 600 MG 12 hr tablet, Take 1 tablet (600 mg total) by mouth 2 (two) times daily., Disp: 60 tablet, Rfl: 1   guaiFENesin-dextromethorphan (ROBITUSSIN DM) 100-10 MG/5ML syrup, Take 5 mLs by mouth every 4 (four) hours as needed for cough., Disp: 118 mL, Rfl: 0   HYDROcodone-acetaminophen (NORCO/VICODIN) 5-325 MG tablet, Take 1-2 tablets by mouth every 6 (six) hours as needed for moderate pain., Disp: 120 tablet, Rfl: 0   lidocaine-prilocaine (EMLA) cream, Apply to affected area once, Disp: 30 g, Rfl: 3   magnesium oxide (MAG-OX) 400 (240 Mg) MG tablet, Take 1 tablet (400 mg total) by mouth 2 (two) times daily., Disp: 60 tablet, Rfl: 0  ondansetron (ZOFRAN) 8 MG tablet, Take 1 tablet (8 mg total) by mouth 2 (two) times daily as needed for refractory nausea / vomiting. Start on day 3 after carboplatin chemo., Disp: 30 tablet, Rfl: 1   prochlorperazine (COMPAZINE) 10 MG tablet, Take 1 tablet (10 mg total) by mouth every 6 (six) hours as needed (Nausea or vomiting)., Disp: 30 tablet, Rfl: 1   senna-docusate (SENOKOT-S) 8.6-50 MG tablet, Take 1  tablet by mouth at bedtime as needed for mild constipation., Disp: 30 tablet, Rfl: 0   tiZANidine (ZANAFLEX) 4 MG capsule, Take 1 capsule (4 mg total) by mouth 2 (two) times daily as needed for muscle spasms., Disp: 14 capsule, Rfl: 0   Significant Hospital Events: Including procedures, antibiotic start and stop dates in addition to other pertinent events   06/11/2021-seen by ER  Interim History / Subjective:   06/11/21 - see in ER by CCM  Objective   Blood pressure 112/81, pulse (!) 115, temperature 98.6 F (37 C), temperature source Oral, resp. rate (!) 22, SpO2 93 %.       No intake or output data in the 24 hours ending 06/11/21 0102 There were no vitals filed for this visit.  Examination: Extremely frail cachectic male with severe muscle wasting.  Is on 2 L nasal cannula mild respiratory distress but not paradoxical.  Alert and oriented x3.  Normal heart sounds abdomen soft.  Dry skin.   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  Status post starting palliative radiation.  And pending palliative chemoimmunotherapy  -Prognosis appears bleak -Current intercurrent illness such as prognosis behind  Plan - Likely needs hospice -ECOG appears to deteriorated to handle chemo or immunotherapy    A:  Acute hypoxic respiratory failure requiring 2 L nasal cannula present at admissionand progressed to BiPAP -could be pneumonia could be radiation related worsening lung collapse.   06/11/2021 -> 2 L nasal cannula and somewhat respiratory distress. Progressed to BiPAP. Extensive white ut left lung - is baseline and is likely tumor as opposed to fluid  P:   Monitor Oxygen for pulse ox goal greater than 92% BiPAP if neede d Intubate if worse Duplex LE rule out DVT (CTA  ineligible due to AKI) Can get thoracic [place] but doubt fluid    A:   No evidence of brain metastasis MRI 05/18/2021 Normal mental status 06/11/21  P:   Monitor     A:   Hypotension at admission but improved with fluid correction  P:  MAP goal greater than 65 Pressors if needed     A:   Possible HCAP   P:   Check RVP Check urine strep Check urine leg Check procalcitonin Vanc 6/27 Cefepie 6/27    A:  Acute kidney injury [baseline creatinine 0.5-0.7 mg percent) -present at admission creatinine 1.13 mg percent   P:  Hydrate Maintain MAP greater than 65 Avoid nephrotoxic agents  Severe metabolic acidosis -lactic acid 6.9 mg percent at admission./Suggestive of occult septic shock  Poor clearance  Plan Hydrate and monitor -d/ wEDP     A:  History of hypomagnesemia 05/23/2021.  Currently normal at admission  P: Monitor and replete accordingly    A:   N.p.o.  P:   PPI    A:  Anemia of chronic disease present  on admission 10.2 g% [baseline 12.1 g%]   P:  - PRBC for hgb </= 6.9gm%    - exceptions are   -  if ACS susepcted/confirmed then transfuse for hgb </= 8.0gm%,  or    -  active bleeding with hemodynamic instability, then transfuse regardless of hemoglobin value   At at all times try to transfuse 1 unit prbc as possible with exception of active hemorrhage    A Normal platelets at admission  P Monitor with DVT prophylaxis Lovenox dvt prp   A:   At risk for hyper hypoglycemia P:   SSI  MSK/DERM Cancer pain -on opioids prior to admission Severe cachexia and protein calorie malnutrition [albumin baseline 2.5 mg percent] -present on admission  Plan  -Monitor for sacral decub   Best Practice (right click and "Reselect all SmartList Selections" daily)   Diet/type: NPO w/ oral meds DVT prophylaxis: other - tBD based on admit GI prophylaxis: N/A - TBD based n admi Lines: N/A - TBD Foley:  N/A - TBD Code Status:  full  code Last date of multidisciplinary goals of care discussion [see below 06/11/21]   Interdisciplinary Goals of Care Family Meeting   Date carried out:: 06/11/2021  Location of the meeting: Bedside [facility] ER Bed 08  Member's involved: Physician, Family Member or next of kin, and Other: Sister at Conservation officer, nature or Loss adjuster, chartered: patient , sister is Artist. There is a son who is an adult and aligned with them    Discussion: We discussed goals of care for [facility] .  -Both he and his sister are fully aware that the cancer is advanced and potentially terminal.  They are aware that the intent of treatment recommended by oncologist all palliative and to keep him comfortable.  Discussed that the current intercurrent illness probably sets him back and he would not survive a cardiac arrest.  Explained that it might not be able to improve his functional status enough to get chemo or further radiation.  Did explain that there are other measures such as opioids and other medications to keep him comfortable to enhance his quality of life.  He did express that his primary intent is quality of life but he is unsure about CPR.  He wants to reflect on this.  Code status: Full Code but they are contemplating towards comfort and DNR  Disposition: Continue current acute care  Time spent for the meeting: 15    If lactate is clearing and endorgan injury is also stabilizing can probably go to triad -> just as note dictation being completed -> EDP called with progression to BiPAP and poor lactate clearance -> will admit to cCM. Per EDPA -> stil full code    [place]   The patient Charleston Hankin is critically ill with multiple organ systems failure and requires high complexity decision making for assessment and support, frequent evaluation and titration of therapies, application of advanced monitoring technologies and extensive interpretation of  multiple databases.   Critical Care Time devoted to patient care services described in this note is 75  Minutes. This time reflects time of care of this signee Dr Brand Males. This critical care time does not reflect procedure time, or teaching time or supervisory time of PA/NP/Med student/Med Resident etc but could involve care discussion time     Dr. Brand Males, M.D., [facility].C.P Pulmonary and Critical Care Medicine Staff [place] [facility]  Pager: [phone], If no answer or between  15:00h - 7:00h: call [phone]  06/11/2021 1:02 AM   LABS    PULMONARY No results for input(s): PHART, PCO2ART, PO2ART, HCO3, TCO2, O2SAT in the last 168 hours.  Invalid input(s): PCO2, PO2  CBC Recent Labs  Lab 06/09/2021 2245  HGB 10.2*  HCT 32.6*  WBC 3.6*  PLT 245    COAGULATION Recent Labs  Lab 06/06/2021 2245  INR 1.1    CARDIAC  No results for input(s): TROPONINI in the last 168 hours. No results for input(s): PROBNP in the last 168 hours.   CHEMISTRY Recent Labs  Lab 05/22/2021 2245  NA 141  K 4.0  CL 100  CO2 26  GLUCOSE 114*  BUN 40*  CREATININE 1.13  CALCIUM 8.6*  MG 2.3   Estimated Creatinine Clearance: 55.6 mL/min (by C-G formula based on SCr of 1.13 mg/dL).   LIVER Recent Labs  Lab 05/18/2021 2245  AST 28  ALT 14  ALKPHOS 88  BILITOT 0.8  PROT 6.4*  ALBUMIN 2.5*  INR 1.1     INFECTIOUS Recent Labs  Lab 06/08/2021 2245  LATICACIDVEN 6.9*     ENDOCRINE CBG (last 3)  No results for input(s): GLUCAP in the last 72 hours.       IMAGING x48h  - image(s) personally visualized  -   highlighted in bold DG Chest 2 View  Result Date: 06/07/2021 CLINICAL DATA:  Suspected sepsis EXAM: CHEST - 2 VIEW COMPARISON:  05/21/2021.  CT 05/18/2021 FINDINGS: Near complete opacification of the left hemithorax. Only a small amount of aerated left upper lobe, which is improved since prior study. Right  lung clear. Heart normal size. IMPRESSION: Near complete opacification of the left hemithorax with only a small amount of aerated left upper lobe. Electronically Signed   By: Rolm Baptise M.D.   On: 05/22/2021 22:40

## 2021-06-11 NOTE — TOC Initial Note (Signed)
Transition of Care [facility]) - Initial/Assessment Note    Patient Details  Name: Bobby Pope MRN: [id number] Date of Birth: 12/10/1956  Transition of [facility]) CM/SW Contact:    Leeroy Cha, RN Phone Number: 06/11/2021, 8:59 AM  Clinical Narrative:                 65 year old male patient of Dr. Benay Pike [facility] [place] oncologist.  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  He also has adrenal metastasis and retroperitoneal lesion his PD-L1 is 10% without any targeted mutation.  He started palliative radiation therapy.  Combination carboplatin, paclitaxel and Beryle Flock is pending.  He also has cancer-related pain and severe cancer cachexia with weight loss.  He and his sister were clear understanding that the intent is palliative and is to help symptom control.  They clearly understand his cancer is not curative and is in fact widespread. Toc will follow for progression and poss, hospital death. Expected Discharge Plan: [place] Barriers to Discharge: Continued Medical Work up   Patient Goals and CMS Choice        Expected Discharge Plan and Services Expected Discharge Plan: [place]   Discharge Planning Services: CM Consult   Living arrangements for the past 2 months: Single Family Home                                      Prior Living Arrangements/Services Living arrangements for the past 2 months: Single Family Home Lives with:: Self                   Activities of Daily Living Home Assistive Devices/Equipment: None ADL Screening (condition at time of admission) Patient's cognitive ability adequate to safely complete daily activities?: Yes Is the patient deaf or have difficulty hearing?:  No Does the patient have difficulty seeing, even when wearing glasses/contacts?: No Does the patient have difficulty concentrating, remembering, or making decisions?: No Patient able to express need for assistance with ADLs?: Yes Does the patient have difficulty dressing or bathing?: Yes Independently performs ADLs?: No Communication: Independent Dressing (OT): Needs assistance Is this a change from baseline?: Pre-admission baseline Grooming: Needs assistance Is this a change from baseline?: Pre-admission baseline Feeding: Independent Bathing: Needs assistance Is this a change from baseline?: Pre-admission baseline Toileting: Needs assistance Is this a change from baseline?: Pre-admission baseline In/Out Bed: Needs assistance Is this a change from baseline?: Pre-admission baseline Walks in Home: Needs assistance Is this a change from baseline?: Pre-admission baseline Does the patient have difficulty walking or climbing stairs?: Yes Weakness of Legs: Both Weakness of Arms/Hands: Both  Permission Sought/Granted                  Emotional Assessment   Attitude/Demeanor/Rapport: Engaged Affect (typically observed): Calm Orientation: : Oriented to Self, Oriented to Place, Oriented to  Time, Oriented to Situation Alcohol / Substance Use: Not Applicable Psych Involvement: No (comment)  Admission diagnosis:  Acute and chronic respiratory failure (acute-on-chronic) (HCC) [J96.20] Sepsis with acute hypoxic respiratory failure and septic shock, due to unspecified organism ([place]) [A41.9, R65.21, J96.01] Patient Active Problem List   Diagnosis Date Noted   Acute  and chronic respiratory failure (acute-on-chronic) ([place]) 06/11/2021   Protein-calorie malnutrition, severe 05/20/2021   Hypercalcemia 05/18/2021   Hypomagnesemia 05/18/2021   Malignant neoplasm of left lung ([place])    PCP:  Pcp, No Pharmacy:   [facility], [place] - [place] [address] Phone: [phone] Fax: [phone]  [id number] [place] [id number]: [phone]: [phone]    Social Determinants of Health (SDOH) Interventions    Readmission Risk Interventions No flowsheet data found.

## 2021-06-11 NOTE — ED Notes (Signed)
Pt in and out cathed, output  526mls. Brief changed, linen changed. Pt repositioned in bed. Offers no complaints at this time.

## 2021-06-11 NOTE — H&P (Signed)
See ccm "coonsult note" that serves as H&P     SIGNATURE    Dr. Brand Males, M.D., F.C.C.P,  Pulmonary and Critical Care Medicine Staff Physician, Home [place] Director - Interstitial Lung Disease  Program  Pulmonary [place] at [place], [place], [address]  Pager: [phone], If no answer  OR between  19:00-7:00h: page [phone] Telephone (clinical office): [phone] Telephone (research): [phone]  1:49 AM 06/11/2021

## 2021-06-11 NOTE — Consult Note (Signed)
NAME:  Bobby Pope, MRN:  [id number], DOB:  1956/03/13, LOS: 0 ADMISSION DATE:  05/23/2021, CONSULTATION DATE:  06/11/21 REFERRING MD:  Bobby Pope ER App  Bobby Pope, CHIEF COMPLAINT:  lactic acidosis and concern for sepsi   History of Present Illness: from patient, sister at bedside, and ER APP and record review   65 year old male patient of Dr. Benay Pope [place] oncologist.  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  He also has adrenal metastasis and retroperitoneal lesion his PD-L1 is 10% without any targeted mutation.  He started palliative radiation therapy.  Combination carboplatin, paclitaxel and Beryle Flock is pending.  He also has cancer-related pain and severe cancer cachexia with weight loss.  He and his sister were clear understanding that the intent is palliative and is to help symptom control.  They clearly understand his cancer is not curative and is in fact widespread.  His most recent radiation was on 06/07/2021 and at home started having epistaxis and coughing and slight increase in lethargy along with fever and chills at home.  Fever 102.4 at admit. He had acute kidney injury creatinine 1.13 mg percent at admission along with severe lactic acidosis 6.9 mg percent and acute hypoxic respiratory failure requiring 2 L nasal cannula (new) with chest x-ray showing severe left whiteout that is baseline.  Blood pressure was soft but with fluid resuscitation it normalized.  Critical care medicine consulted by ER  Port-A-Cath and chemotherapy are pending.  Sister feels he is currently unstable for those.  Pertinent  Medical History  Lung SCC, stage 4 Tobacco abuse   Significant Hospital Events: Including procedures, antibiotic start and stop  dates in addition to other pertinent events   06/11/2021-admitted, started on bipap. Code status changed to DNR.  Interim History / Subjective:  He feels better since starting BiPAP. No pain currently.  Objective   Blood pressure 111/68, pulse (!) 130, temperature 99.6 F (37.6 C), resp. rate (!) 27, height _0  (1.753 m), weight 60 kg, SpO2 93 %.        Intake/Output Summary (Last 24 hours) at 06/11/2021 0802 Last data filed at 06/11/2021 0436 Gross per 24 hour  Intake 2300 ml  Output 501 ml  Net 1799 ml   Filed Weights   06/11/21 0312  Weight: 60 kg    Examination: General: resting in bed comfortably on bipap HEENT: Bobby Pope, eyes anicteric. Poor dentition. Bipap mask in place. Cardio: S1S2, tachycardic Resp: minimal breath sounds on the left, coarse on the R Abd soft, ND Extremities: no cyanosis or edema Derm: warm, dry Neuro: awake, alert, answering questions appropriately as best he can with bipap mask. Moving all extremities.  Personally reviewed labs, CXR, recent PET scan.   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 Has metastases to adrenal glands, retroperitoneal nodes, widespread skeletal metastasis.  Status post starting palliative radiation, pending palliative chemoimmunotherapy Possible CAP -Discussed his prognosis today; he and his sister understand and want to ensure he remains comfortable. Morphine and ativan PRN. -BiPAP PRN for comfort. Supplemental O2 when not  on bipap. -Can start empiric lovenox in case he had a PE, but not a great candidate for TPA given chronic hemoptysis- likely has bleeding from tumor that could worsen. With limited vascular beds for the pulmonary circulation, he should be developing pulmonary hypertension as a manifestation  of this disease process, making RV strain a low yield marker for him.  -Palliative care consult  -con't empiric antibiotics  Hypotension and tachycardia: from tamponade effect of tumor. Less likely PE -IV bolus -ok to still give meds to focus on comfort. -can avoid LE [place] in this case given empiric treatment with lovenox. Doubt he would tolerate positioning for this.  Lactic acidosis- suspect this is multifactorial from hypoxia, tumor burden with likely areas of necrosis. Severe metabolic acidosis -trend -ok to continue maintenance fluids  Acute kidney injury - improving. -con't to monitor renal function -renally dose meds, avoid nephrotoxic meds -strict I/Os  Anemia of chronic disease -transfuse for Hb <7 or hemodynamically significant bleeding  Cancer pain -on opioids prior to admission -morphine PRN  Severe cachexia and protein calorie malnutrition  -pleasure feeding   Best Practice (right click and "Reselect all SmartList Selections" daily)   Diet/type: Regular consistency (see orders) DVT prophylaxis: systemic dose LMWH  GI prophylaxis: N/A  Lines: N/A   Foley:  N/A   Code Status:  full code Last date of multidisciplinary goals of care discussion [see below 06/11/21]   LABS    PULMONARY Recent Labs  Lab 06/11/21 0139 06/11/21 0520  PHART 7.140* 7.362  PCO2ART 89.2* 48.6*  PO2ART <31.0* 400*  HCO3 29.1* 26.7  O2SAT 12.3 99.3    CBC Recent Labs  Lab 06/03/2021 2245 06/11/21 0706  HGB 10.2* 8.7*  HCT 32.6* 27.9*  WBC 3.6* 2.7*  PLT 245 148*      CHEMISTRY Recent Labs  Lab 06/07/2021 2245 06/11/21 0706  NA 141 141  K 4.0 4.1  CL 100 102  CO2 26 29  GLUCOSE 114* 105*  BUN 40* 38*  CREATININE 1.13 0.76  CALCIUM 8.6* 8.2*  MG 2.3 2.0  PHOS  --  2.7    Estimated Creatinine Clearance: 79.2 mL/min (by C-G formula based on SCr of 0.76 mg/dL).  This patient is critically ill with multiple organ system failure which requires frequent high  complexity decision making, assessment, support, evaluation, and titration of therapies. This was completed through the application of advanced monitoring technologies and extensive interpretation of multiple databases. During this encounter critical care time was devoted to patient care services described in this note for 45 minutes.  Julian Hy, DO 06/11/21 9:24 AM [place] [facility]

## 2021-06-11 NOTE — Progress Notes (Signed)
PCCM interval progress note:   Pt had rapidly worsening hypotension and mental status this afternoon.  Met with patient's sisters at the bedside who understand that his diagnosis is terminal and do not want to prolong his suffering.   They would like to transition to comfort-focused care at this time.  Spiritual care consulted and orders for comfort care placed.   Otilio Carpen Jonet Mathies, PA-C

## 2021-06-11 NOTE — Sepsis Progress Note (Signed)
Elink Code Sepsis Completion Note  Pt had BC drawn prior to to administering ABX. Had IVF bolus replacement per body weight. LA went from 6.9 to 7.1. Still waiting for ED RN to drawn repeat LA. Being admitted to the ICU at [facility] for further monitoring.  Canesha Tesfaye DNP Warren Lacy RN [phone] AM

## 2021-06-11 NOTE — Progress Notes (Signed)
eLink Physician-Brief Progress Note Patient Name: [name] DOB: 1956-07-15 MRN: [id number]   Date of Service  06/11/2021  HPI/Events of Note  Lactic Acid  8.9 --> 7.1.   eICU Interventions  Plan: Repeat Lactic Acid now.     Intervention Category Major Interventions: Acid-Base disturbance - evaluation and management  Worthington Cruzan [place] 06/11/2021, 6:05 AM

## 2021-06-11 NOTE — Progress Notes (Signed)
PHARMACY - PHYSICIAN COMMUNICATION CRITICAL VALUE ALERT - BLOOD CULTURE IDENTIFICATION (BCID)  Bobby Pope is an 65 y.o. male who presented to [facility] on 06/01/2021 with a chief complaint of fever, cough.   Assessment: Patient has PMH significant for metastatic squamous cell carcinoma of the lung who is currently on palliative radiation and chemotherapy PTA. Pt currently on antibiotics for PNA.  BCID + 1/2 streptococcus pneumoniae; 4/4 enterobacterales (no organism identified on BCID panel).   Name of physician (or Provider) Contacted: Dr. Carlis Abbott  Current antibiotics: Cefepime  Changes to prescribed antibiotics recommended: No changes. Continue cefepime and await identification and sensitivities.   Results for orders placed or performed during the hospital encounter of 06/08/2021  Blood Culture ID Panel (Reflexed) (Collected: 05/23/2021 10:45 PM)  Result Value Ref Range   Enterococcus faecalis NOT DETECTED NOT DETECTED   Enterococcus Faecium NOT DETECTED NOT DETECTED   Listeria monocytogenes NOT DETECTED NOT DETECTED   Staphylococcus species NOT DETECTED NOT DETECTED   Staphylococcus aureus (BCID) NOT DETECTED NOT DETECTED   Staphylococcus epidermidis NOT DETECTED NOT DETECTED   Staphylococcus lugdunensis NOT DETECTED NOT DETECTED   Streptococcus species DETECTED (A) NOT DETECTED   Streptococcus agalactiae NOT DETECTED NOT DETECTED   Streptococcus pneumoniae DETECTED (A) NOT DETECTED   Streptococcus pyogenes NOT DETECTED NOT DETECTED   A.calcoaceticus-baumannii NOT DETECTED NOT DETECTED   Bacteroides fragilis NOT DETECTED NOT DETECTED   Enterobacterales DETECTED (A) NOT DETECTED   Enterobacter cloacae complex NOT DETECTED NOT DETECTED   Escherichia coli NOT DETECTED NOT DETECTED   Klebsiella aerogenes NOT DETECTED NOT DETECTED   Klebsiella oxytoca NOT DETECTED NOT DETECTED   Klebsiella pneumoniae NOT DETECTED NOT DETECTED   Proteus species NOT DETECTED NOT DETECTED    Salmonella species NOT DETECTED NOT DETECTED   Serratia marcescens NOT DETECTED NOT DETECTED   Haemophilus influenzae NOT DETECTED NOT DETECTED   Neisseria meningitidis NOT DETECTED NOT DETECTED   Pseudomonas aeruginosa NOT DETECTED NOT DETECTED   Stenotrophomonas maltophilia NOT DETECTED NOT DETECTED   Candida albicans NOT DETECTED NOT DETECTED   Candida auris NOT DETECTED NOT DETECTED   Candida glabrata NOT DETECTED NOT DETECTED   Candida krusei NOT DETECTED NOT DETECTED   Candida parapsilosis NOT DETECTED NOT DETECTED   Candida tropicalis NOT DETECTED NOT DETECTED   Cryptococcus neoformans/gattii NOT DETECTED NOT DETECTED   CTX-M ESBL NOT DETECTED NOT DETECTED   Carbapenem resistance IMP NOT DETECTED NOT DETECTED   Carbapenem resistance KPC NOT DETECTED NOT DETECTED   Carbapenem resistance NDM NOT DETECTED NOT DETECTED   Carbapenem resist OXA 48 LIKE NOT DETECTED NOT DETECTED   Carbapenem resistance VIM NOT DETECTED NOT DETECTED    Lenis Noon, PharmD 06/11/2021  4:17 PM

## 2021-06-11 NOTE — ED Notes (Signed)
PA Mia at bedside 

## 2021-06-12 ENCOUNTER — Ambulatory Visit: Payer: Medicaid Other

## 2021-06-12 ENCOUNTER — Encounter: Payer: Self-pay | Admitting: Radiation Oncology

## 2021-06-12 LAB — HEMOGLOBIN A1C
Hgb A1c MFr Bld: 5.6 % (ref 4.8–5.6)
Mean Plasma Glucose: 114 mg/dL

## 2021-06-12 LAB — URINE CULTURE: Culture: NO GROWTH

## 2021-06-12 LAB — LEGIONELLA PNEUMOPHILA SEROGP 1 UR AG: L. pneumophila Serogp 1 Ur Ag: NEGATIVE

## 2021-06-13 ENCOUNTER — Observation Stay (HOSPITAL_COMMUNITY): Payer: MEDICAID

## 2021-06-13 ENCOUNTER — Ambulatory Visit: Payer: Medicaid Other

## 2021-06-13 ENCOUNTER — Other Ambulatory Visit (HOSPITAL_COMMUNITY): Payer: Self-pay

## 2021-06-13 LAB — CULTURE, BLOOD (ROUTINE X 2): Special Requests: ADEQUATE

## 2021-06-14 ENCOUNTER — Ambulatory Visit: Payer: MEDICAID

## 2021-06-14 LAB — CULTURE, BLOOD (ROUTINE X 2): Special Requests: ADEQUATE

## 2021-06-14 NOTE — Death Summary Note (Signed)
DEATH SUMMARY   Patient Details  Name: Bobby Pope MRN: [id number] DOB: 01-16-1956  Admission/Discharge Information   Admit Date:  June 22, 2021  Date of Death: Date of Death: Jun 24, 2021  Time of Death: Time of Death: 0657  Length of Stay: 1  Referring Physician: Pcp, No   Reason(s) for Hospitalization    Diagnoses  Preliminary cause of death:  Secondary Diagnoses (including complications and co-morbidities):  Active Problems:   Acute and chronic respiratory failure (acute-on-chronic) (HCC) Septic shock due to Strep pneumoniae and  Morganella Morganii- present on admission Pneumonia and bacteremia due to Strep pneumoniae and  Morganella Morganii- present on admission Acute respiratory failure with hypoxia and hypercapnia- present on admission Stage 4 lung cancer- non-small cell,  present on admission Cachexia,  present on admission Protein calorie malnutrition Lactic acidosis,  present on admission AKI,  present on admission Cancer-related pain Pancytopenia- present on admission Prediabetes present on admission  Brief Hospital Course (including significant findings, care, treatment, and services provided and events leading to death)  Bobby Pope is a 65 y.o. year old male who is a patient of Dr. Arletha Pili Iruku [facility] oncologist.  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  He also has adrenal metastasis and retroperitoneal lesion his PD-L1 is 10% without any targeted mutation.  He started palliative radiation therapy.  Combination carboplatin, paclitaxel and Beryle Flock is pending.  He also has cancer-related pain and severe cancer cachexia with weight loss.  He and his sister were clear understanding that the intent is palliative and is to help  symptom control.  They clearly understand his cancer is not curative and is in fact widespread.  His most recent radiation was on June 22, 2021 and at home started having epistaxis and coughing and slight increase in lethargy along with fever and chills at home.  Fever 102.4 at admit. He had acute kidney injury creatinine 1.13 mg percent at admission along with severe lactic acidosis 6.9 mg percent and acute hypoxic respiratory failure requiring 2 L nasal cannula (new) with chest x-ray showing severe left whiteout that is baseline.  Blood pressure was soft but with fluid resuscitation it normalized.  Critical care medicine consulted by ER   Port-A-Cath and chemotherapy are pending.  Sister feels he is currently unstable for those.  After admission he was managed with vasopressors, bipap for respiratory support, IVF, and antibiotics. He met with palliative care and given his poor prognosis elected to focus on optimizing comfort rather than escalating aggressive care. His family was present with him and he passed away on 06-24-2021 at 6:57 AM.   Pertinent Labs and Studies  Significant Diagnostic Studies DG Chest 2 View  Result Date: 06-22-21 CLINICAL DATA:  Suspected sepsis EXAM: CHEST - 2 VIEW COMPARISON:  05/21/2021.  CT 05/18/2021 FINDINGS: Near complete opacification of the left hemithorax. Only a small amount of aerated left upper lobe, which is improved since prior study. Right lung clear. Heart normal size. IMPRESSION: Near complete opacification of the left hemithorax with only a small amount of aerated left upper lobe. Electronically Signed   By: Rolm Baptise M.D.   On: 06/22/2021 22:40   DG Chest 2 View  Result Date: 05/17/2021 CLINICAL DATA:  Lower back pain, status post fall. EXAM: CHEST - 2 VIEW COMPARISON:  None. FINDINGS: There is complete opacification of the left hemithorax with a very small amount of aerated lung noted within the left lung base. The right lung is mildly hyperinflated and  otherwise clear. Right to left shift of the cardiac silhouette is noted which is subsequently limited in evaluation. There is marked severity calcification of the thoracic aorta. Ill-defined deformities of the third, sixth and seventh left ribs are seen. IMPRESSION: Findings likely consistent with partial collapse of the left lung with associated atelectasis, infiltrate and pleural effusion. Correlation with chest CT is recommended, as an underlying mass cannot be excluded. Electronically Signed   By: Virgina Norfolk M.D.   On: 05/17/2021 23:28   DG Lumbar Spine Complete  Result Date: 05/17/2021 CLINICAL DATA:  Status post fall with lower back pain. EXAM: LUMBAR SPINE - COMPLETE 4+ VIEW COMPARISON:  None. FINDINGS: There is no evidence of acute lumbar spine fracture. Alignment is normal. Mild multilevel endplate sclerosis is seen. Intervertebral disc spaces are maintained. Moderate severity calcification of the abdominal aorta is noted. IMPRESSION: Multilevel degenerative changes without an acute lumbar spine fracture or subluxation. Electronically Signed   By: Virgina Norfolk M.D.   On: 05/17/2021 23:33   MR BRAIN W WO CONTRAST  Result Date: 05/18/2021 CLINICAL DATA:  Left chest malignancy.  Staging. EXAM: MRI HEAD WITHOUT AND WITH CONTRAST TECHNIQUE: Multiplanar, multiecho pulse sequences of the brain and surrounding structures were obtained without and with intravenous contrast. CONTRAST:  43m GADAVIST GADOBUTROL 1 MMOL/ML IV SOLN COMPARISON:  None. FINDINGS: Brain: The brain itself has a normal appearance without evidence atrophy, old or acute small or large vessel infarction, primary or metastatic mass lesion, hemorrhage, hydrocephalus or extra-axial collection. No abnormal contrast enhancement occurs. Vascular: Major vessels at the base of the brain show flow. Skull and upper cervical spine: Question abnormal signal in the tip of the clivus in the dens. Osseous metastatic disease not excluded.  Sinuses/Orbits: Opacified maxillary sinuses, more extensively on the right than the left. Orbits negative. Other: Frontal scalp lipoma. IMPRESSION: Motion degraded study, but without evidence of intracranial metastatic disease. Question abnormal marrow signal at the tip of the clivus and affecting the dens. Metastatic disease to the bones of these levels not excluded. Maxillary sinus opacification/inflammatory change, worse on the right than the left. Electronically Signed   By: MNelson ChimesM.D.   On: 05/18/2021 17:19   CT CHEST ABDOMEN PELVIS W CONTRAST  Result Date: 05/18/2021 CLINICAL DATA:  Lower back pain status post fall. Partial collapse of left lung with associated infiltrate and pleural effusion. Question underlying mass on chest x-ray. EXAM: CT CHEST, ABDOMEN, AND PELVIS WITH CONTRAST CT LUMBAR SPINE WITHOUT CONTRAST TECHNIQUE: Multidetector CT imaging of the chest, abdomen and pelvis was performed following the standard protocol during bolus administration of intravenous contrast. Multidetector CT imaging of the lumbar spine was performed without intravenous contrast administration. Multiplanar CT image reconstructions were also generated. CONTRAST:  1049mOMNIPAQUE IOHEXOL 300 MG/ML  SOLN COMPARISON:  Chest x-ray 05/17/2021 FINDINGS: CT CHEST FINDINGS Cardiovascular: Normal heart size. No significant pericardial effusion. The thoracic aorta is normal in caliber. Mild-to-moderate atherosclerotic plaque of the thoracic aorta. At least 3 vessel coronary artery calcifications. The main pulmonary artery is enlarged in caliber measuring up to 3.7 cm. No central or proximal segmental pulmonary embolus. Mediastinum/Nodes: Mediastinal lymphadenopathy with as an example a 1.5 cm precarinal lymph node (2:27). Also noted subcarinal lymph node measuring 1.6 cm (2:33). No definite right hilar lymphadenopathy. Left hilar lymphadenopathy unable  to measure. Lungs/Pleura: Almost complete heterogeneous opacification of  the left hemithorax with associated pleural thickening and nodularity. Underlying pulmonary mass is difficult to measure. Invasion into the mediastinum is noted. Associated narrowing of the left main pulmonary artery as well as left mainstem bronchus. Associated loculated left pleural effusion. Several right lower lobe pulmonary nodule measuring up to 0.9 x 0.5 cm (4:121). Calcified pulmonary nodule within the right upper lobe. Pulmonary micronodule within the right upper lobe (4:35). Debris within the right mainstem bronchus and trachea. As well as within the right lower lobe bronchials. Musculoskeletal: Scattered axial and appendicular skeleton lytic lesions. Question pathologic nondisplaced fracture of the right posterior tenth rib (2:49). CT ABDOMEN PELVIS FINDINGS Hepatobiliary: No focal liver abnormality. No gallstones, gallbladder wall thickening, or pericholecystic fluid. No biliary dilatation. Pancreas: No focal lesion. Normal pancreatic contour. No surrounding inflammatory changes. No main pancreatic ductal dilatation. Spleen: Normal in size without focal abnormality. Adrenals/Urinary Tract: No right adrenal nodule. There is a retroperitoneal left upper 3.3 x 2.1 cm lobulated lesion that may be arising from the left adrenal gland. Bilateral kidneys enhance symmetrically. No hydronephrosis. No hydroureter. The urinary bladder is unremarkable. Stomach/Bowel: Stomach is within normal limits. No evidence of bowel wall thickening or dilatation. Appendix appears normal. Vascular/Lymphatic: No abdominal aorta or iliac aneurysm. Moderate atherosclerotic plaque of the aorta and its branches. No pelvic or inguinal lymphadenopathy. Reproductive: The prostate is enlarged measuring up to 5.1 cm. Other: No intraperitoneal free fluid. No intraperitoneal free gas. No organized fluid collection. Musculoskeletal: Scattered axial and appendicular skeleton lytic lesions. CT LUMBAR SPINE: Segmentation: 5 non-rib-bearing lumbar  vertebral bodies. Alignment: Normal. Vertebra: Several lytic lesions with cortical destruction, as an example: Posterior wall and inferior endplate of the L4 level (504:22), right iliac bone (3:97), S1 level (504:22, 3:101). Soft tissues: Question associated soft tissue densities along the lytic lesions with as an example along the right iliac bone (2:91). IMPRESSION: 1. 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 Underlying pulmonary mass difficult to measure/visualize. Associated loculated left pleural effusion. Invasion into the mediastinum with associated narrowing of the left main pulmonary artery as well as left mainstem bronchus. 2. Mediastinal and left hilar lymphadenopathy. 3. Several right lower lobe pulmonary nodules measuring up to 0.9 x 0.5 cm. Findings could represent metastases versus infection/inflammation given debris within the right mainstem bronchus and trachea as well as within the right lower lobe bronchioles with associated right lower lobe mucous plugging. 4. Axial and appendicular skeleton lytic lesions with cortical destruction consistent with metastases. 5. Question pathologic nondisplaced fracture of the right posterior tenth rib. 6. A left retroperitoneal 3.3 x 2.1 cm lobulated lesion may be arising from the left adrenal gland versus represent a lymph node. Finding likely metastasis. 7. Prostatomegaly. Electronically Signed   By: Iven Finn M.D.   On: 05/18/2021 02:05   NM PET Image Initial (PI) Skull Base To Thigh  Result Date: 05/27/2021 CLINICAL DATA:  Initial treatment strategy for non-small cell lung cancer. EXAM: NUCLEAR MEDICINE PET SKULL BASE TO THIGH TECHNIQUE: 8.09 mCi F-18 FDG was injected intravenously. Full-ring PET imaging was performed from the skull base to thigh after the radiotracer. CT data was obtained and used for attenuation correction and anatomic localization. Fasting blood glucose: 76 mg/dl  COMPARISON:  CT chest 05/18/2021 FINDINGS: Mediastinal blood pool activity: SUV max 1.26 Liver activity: SUV max NA NECK: No hypermetabolic lymph nodes in the neck. Incidental CT findings: none CHEST: As noted previously there is extensive  tumor involving nearly the entire left hemithorax which is markedly FDG avid on today's exam. This measures at least 20 cm maximum dimension. Relative decreased FDG uptake within the central, perihilar left lung compatible atelectatic lung and/or tumor necrosis. There is a loculated pleural effusion at the base of the left hemithorax which measures 9.8 x 4.3 cm, image 84/4. Tumor invasion of the left hilum and left mediastinum with encasement and marked narrowing of the left mainstem bronchus. Tumor also appears to extend through the lateral aspect of the left hemidiaphragm into the left upper quadrant of the abdomen. FDG avid subcarinal, bilateral mediastinal and subcarinal adenopathy identified, including: -right paratracheal lymph node has an SUV max of 11.42. -subcarinal lymph node has an SUV max of 8.41. FDG avid posterior mediastinal lymph nodes are also noted including a right-sided aortoesophageal lymph node measuring 1.9 cm with SUV max of 15.3, image 75/4 Incidental CT findings: Aortic atherosclerosis. Coronary artery calcifications. ABDOMEN/PELVIS: No abnormal FDG uptake within the liver, pancreas, or spleen. Normal adrenal glands. FDG avid upper abdominal lymph nodes are identified, including: -gastrohepatic ligament lymph node with SUV max of 5.68. -left retroperitoneal lymph node measuring 1.8 cm within SUV max of 4.4, image 99/4 Incidental CT findings: Aortic atherosclerosis. SKELETON: Extensive FDG avid lytic bone metastases are identified throughout the axial and appendicular skeleton. These lesions are too numerous to count, including: FDG avid T10 lesion which has an SUV max of 24.10 with FDG avid left posteromedial rib lesion at the costovertebral junction  measures 1.2 cm with SUV max of 19.2, image 86/4. Lytic lesion within the right iliac bone measures 2.9 cm and has an SUV max of 26, image 130/4. Large lytic lesion involving the left side of the sacrum measures 3.3 cm and has an SUV max of 29, image 136/4. FDG avid lytic lesion involving the right pedicle of the T5 vertebra with signs of spinal involvement with possible mass effect upon the cord is noted. This measures 2.1 cm with SUV max of 18.57, image 39/4. Incidental CT findings: none IMPRESSION: 1. Examination shows extensive FDG avid tumor involving nearly the entire left hemithorax. Tumor invades the left hilum and left mediastinum with marked narrowing of the left mainstem bronchus. Tumor also appears to extend through the left hemidiaphragm into the left lower quadrant of the abdomen. 2. FDG avid bilateral mediastinal, subcarinal, and upper abdominal nodal metastasis. 3. Widespread lytic bone metastases involving the axial and appendicular skeleton. Bone lesions are too numerous to count. 4. FDG avid lytic lesion involving the pedicle of T5 is noted with central canal extension. This may be of neuro surgical significance. Consider further evaluation with contrast enhanced MRI of the thoracic spine to assess for canal involvement by disease. 5. These results will be called to the ordering clinician or representative by the Radiologist Assistant, and communication documented in the PACS or [employer]. Electronically Signed   By: Kerby Moors M.D.   On: 05/27/2021 17:09   CT L-SPINE NO CHARGE  Result Date: 05/18/2021 CLINICAL DATA:  Lower back pain status post fall. Partial collapse of left lung with associated infiltrate and pleural effusion. Question underlying mass on chest x-ray. EXAM: CT CHEST, ABDOMEN, AND PELVIS WITH CONTRAST CT LUMBAR SPINE WITHOUT CONTRAST TECHNIQUE: Multidetector CT imaging of the chest, abdomen and pelvis was performed following the standard protocol during bolus  administration of intravenous contrast. Multidetector CT imaging of the lumbar spine was performed without intravenous contrast administration. Multiplanar CT image reconstructions were also generated. CONTRAST:  126m OMNIPAQUE IOHEXOL 300 MG/ML  SOLN COMPARISON:  Chest x-ray 05/17/2021 FINDINGS: CT CHEST FINDINGS Cardiovascular: Normal heart size. No significant pericardial effusion. The thoracic aorta is normal in caliber. Mild-to-moderate atherosclerotic plaque of the thoracic aorta. At least 3 vessel coronary artery calcifications. The main pulmonary artery is enlarged in caliber measuring up to 3.7 cm. No central or proximal segmental pulmonary embolus. Mediastinum/Nodes: Mediastinal lymphadenopathy with as an example a 1.5 cm precarinal lymph node (2:27). Also noted subcarinal lymph node measuring 1.6 cm (2:33). No definite right hilar lymphadenopathy. Left hilar lymphadenopathy unable to measure. Lungs/Pleura: Almost complete heterogeneous opacification of the left hemithorax with associated pleural thickening and nodularity. Underlying pulmonary mass is difficult to measure. Invasion into the mediastinum is noted. Associated narrowing of the left main pulmonary artery as well as left mainstem bronchus. Associated loculated left pleural effusion. Several right lower lobe pulmonary nodule measuring up to 0.9 x 0.5 cm (4:121). Calcified pulmonary nodule within the right upper lobe. Pulmonary micronodule within the right upper lobe (4:35). Debris within the right mainstem bronchus and trachea. As well as within the right lower lobe bronchials. Musculoskeletal: Scattered axial and appendicular skeleton lytic lesions. Question pathologic nondisplaced fracture of the right posterior tenth rib (2:49). CT ABDOMEN PELVIS FINDINGS Hepatobiliary: No focal liver abnormality. No gallstones, gallbladder wall thickening, or pericholecystic fluid. No biliary dilatation. Pancreas: No focal lesion. Normal pancreatic contour.  No surrounding inflammatory changes. No main pancreatic ductal dilatation. Spleen: Normal in size without focal abnormality. Adrenals/Urinary Tract: No right adrenal nodule. There is a retroperitoneal left upper 3.3 x 2.1 cm lobulated lesion that may be arising from the left adrenal gland. Bilateral kidneys enhance symmetrically. No hydronephrosis. No hydroureter. The urinary bladder is unremarkable. Stomach/Bowel: Stomach is within normal limits. No evidence of bowel wall thickening or dilatation. Appendix appears normal. Vascular/Lymphatic: No abdominal aorta or iliac aneurysm. Moderate atherosclerotic plaque of the aorta and its branches. No pelvic or inguinal lymphadenopathy. Reproductive: The prostate is enlarged measuring up to 5.1 cm. Other: No intraperitoneal free fluid. No intraperitoneal free gas. No organized fluid collection. Musculoskeletal: Scattered axial and appendicular skeleton lytic lesions. CT LUMBAR SPINE: Segmentation: 5 non-rib-bearing lumbar vertebral bodies. Alignment: Normal. Vertebra: Several lytic lesions with cortical destruction, as an example: Posterior wall and inferior endplate of the L4 level (504:22), right iliac bone (3:97), S1 level (504:22, 3:101). Soft tissues: Question associated soft tissue densities along the lytic lesions with as an example along the right iliac bone (2:91). IMPRESSION: 1. 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 Underlying pulmonary mass difficult to measure/visualize. Associated loculated left pleural effusion. Invasion into the mediastinum with associated narrowing of the left main pulmonary artery as well as left mainstem bronchus. 2. Mediastinal and left hilar lymphadenopathy. 3. Several right lower lobe pulmonary nodules measuring up to 0.9 x 0.5 cm. Findings could represent metastases versus infection/inflammation given debris within the right mainstem bronchus and trachea as well as  within the right lower lobe bronchioles with associated right lower lobe mucous plugging. 4. Axial and appendicular skeleton lytic lesions with cortical destruction consistent with metastases. 5. Question pathologic nondisplaced fracture of the right posterior tenth rib. 6. A left retroperitoneal 3.3 x 2.1 cm lobulated lesion may be arising from the left adrenal gland versus represent a lymph node. Finding likely metastasis. 7. Prostatomegaly. Electronically Signed   By: MIven FinnM.D.   On: 05/18/2021 02:05   CT BIOPSY  Result Date: 05/20/2021 INDICATION: 65year old male with significant left  hemithorax malignant process and multifocal lytic osseous lesions concerning for primary bronchogenic carcinoma with bone metastases. He presents for CT-guided biopsy of 1 of the lytic bone lesions. EXAM: CT-guided core biopsy right iliac bone lesion MEDICATIONS: None. ANESTHESIA/SEDATION: Moderate (conscious) sedation was employed during this procedure. A total of Versed 1 mg and Fentanyl 50 mcg was administered intravenously. Moderate Sedation Time: 10 minutes. The patient's level of consciousness and vital signs were monitored continuously by radiology nursing throughout the procedure under my direct supervision. FLUOROSCOPY TIME:  None. COMPLICATIONS: None immediate. PROCEDURE: Informed written consent was obtained from the patient after a thorough discussion of the procedural risks, benefits and alternatives. All questions were addressed. Maximal Sterile Barrier Technique was utilized including caps, mask, sterile gowns, sterile gloves, sterile drape, hand hygiene and skin antiseptic. A timeout was performed prior to the initiation of the procedure. A planning CT scan was performed. The lytic lesion in the right posterior iliac bone was identified. The overlying skin was prepped and marked. Local anesthesia was attained by infiltration with 1% lidocaine. A small dermatotomy was made. Under intermittent CT  guidance, a 13 gauge trocar needle was advanced through the cortex and positioned at the margin of the lytic component of the mass. Multiple 11 gauge core biopsies were then obtained coaxially using the OnControl biopsy device. Biopsy specimens were placed in formalin and delivered to pathology for further analysis. IMPRESSION: Technically successful CT-guided core biopsy of right lytic iliac bone lesion. Electronically Signed   By: Jacqulynn Cadet M.D.   On: 05/20/2021 12:32   DG Chest Portable 1 View  Result Date: 06/11/2021 CLINICAL DATA:  Shortness of breath EXAM: PORTABLE CHEST 1 VIEW COMPARISON:  05/18/2021 FINDINGS: Continued near complete opacification of the left hemithorax, not significantly changed. Probable increasing opacity in the medial right lung base. No right effusion. IMPRESSION: Continue near complete opacification of the left hemithorax. Suspect right infrahilar/medial basilar infiltrate. Electronically Signed   By: Rolm Baptise M.D.   On: 06/11/2021 01:42   DG CHEST PORT 1 VIEW  Result Date: 05/21/2021 CLINICAL DATA:  Fall.  Back pain.  Shortness of breath. EXAM: PORTABLE CHEST 1 VIEW COMPARISON:  05/20/2021.  CT 05/18/2021. FINDINGS: Complete opacification of the left hemithorax with volume loss again noted. No interim change. Right lung pulmonary nodules best identified by prior CT. No pneumothorax. Heart size cannot be assessed. Lytic lesions of the left sixth and seventh ribs again noted. This is again consistent metastatic disease. IMPRESSION: 1. Complete opacification of the left hemithorax with volume loss again noted. No interim change. Right pulmonary nodules best identified by prior CT. Chest is unchanged from prior exam. 2. Lytic lesions of the left sixth and seventh ribs again noted. This is again consistent with metastatic disease. Electronically Signed   By: Marcello Moores  Register   On: 05/21/2021 06:01   DG CHEST PORT 1 VIEW  Result Date: 05/20/2021 CLINICAL DATA:   Shortness of breath. EXAM: PORTABLE CHEST 1 VIEW COMPARISON:  May 17, 2021.  May 18, 2021. FINDINGS: There remains complete consolidation of the left hemithorax consistent with probable mass and effusion as noted on prior CT exam. Right lung is unremarkable. No definite pneumothorax is noted. Lytic lesions are seen involving the lateral portions of the left sixth and seventh ribs consistent with metastatic disease. IMPRESSION: Stable complete consolidation of the left hemithorax as described above. Lytic lesions are noted in the left sixth and seventh ribs consistent with metastatic disease. Electronically Signed   By: Jeneen Rinks  Murlean Caller M.D.   On: 05/20/2021 08:45   VAS [place] LOWER EXTREMITY VENOUS (DVT)  Result Date: 06/11/2021  Lower Venous DVT Study Patient Name:  LYMON KIDNEY  Date of Exam:   06/11/2021 Medical Rec #: [id number]       Accession #:    [id number] Date of Birth: 1956/11/21       Patient Gender: M Patient Age:   64Y Exam Location:  [facility] Procedure:      VAS [place] LOWER EXTREMITY VENOUS (DVT) Referring Phys: [id number] MURALI RAMASWAMY --------------------------------------------------------------------------------  Indications: Hypoxia.  Risk Factors: Cancer. Limitations: Body habitus and poor ultrasound/tissue interface. Comparison Study: No prior studies. Performing Technologist: Oliver Hum RVT  Examination Guidelines: A complete evaluation includes B-mode imaging, spectral Doppler, color Doppler, and power Doppler as needed of all accessible portions of each vessel. Bilateral testing is considered an integral part of a complete examination. Limited examinations for reoccurring indications may be performed as noted. The reflux portion of the exam is performed with the patient in reverse Trendelenburg.  +---------+---------------+---------+-----------+----------+--------------+ RIGHT    CompressibilityPhasicitySpontaneityPropertiesThrombus Aging  +---------+---------------+---------+-----------+----------+--------------+ CFV      Full           Yes      Yes                                 +---------+---------------+---------+-----------+----------+--------------+ SFJ      Full                                                        +---------+---------------+---------+-----------+----------+--------------+ FV Prox  Full                                                        +---------+---------------+---------+-----------+----------+--------------+ FV Mid   Full                                                        +---------+---------------+---------+-----------+----------+--------------+ FV DistalFull                                                        +---------+---------------+---------+-----------+----------+--------------+ PFV      Full                                                        +---------+---------------+---------+-----------+----------+--------------+ POP      Full           Yes      Yes                                 +---------+---------------+---------+-----------+----------+--------------+  PTV      Full                                                        +---------+---------------+---------+-----------+----------+--------------+ PERO     Full                                                        +---------+---------------+---------+-----------+----------+--------------+   +---------+---------------+---------+-----------+----------+--------------+ LEFT     CompressibilityPhasicitySpontaneityPropertiesThrombus Aging +---------+---------------+---------+-----------+----------+--------------+ CFV      Full           Yes      Yes                                 +---------+---------------+---------+-----------+----------+--------------+ SFJ      Full                                                         +---------+---------------+---------+-----------+----------+--------------+ FV Prox  Full                                                        +---------+---------------+---------+-----------+----------+--------------+ FV Mid   Full                                                        +---------+---------------+---------+-----------+----------+--------------+ FV DistalFull                                                        +---------+---------------+---------+-----------+----------+--------------+ PFV      Full                                                        +---------+---------------+---------+-----------+----------+--------------+ POP      Full           Yes      Yes                                 +---------+---------------+---------+-----------+----------+--------------+ PTV      Full                                                        +---------+---------------+---------+-----------+----------+--------------+   PERO     Full                                                        +---------+---------------+---------+-----------+----------+--------------+     Summary: RIGHT: - There is no evidence of deep vein thrombosis in the lower extremity.  - No cystic structure found in the popliteal fossa.  LEFT: - There is no evidence of deep vein thrombosis in the lower extremity.  - No cystic structure found in the popliteal fossa.  *See table(s) above for measurements and observations. Electronically signed by Deitra Mayo MD on 06/11/2021 at 12:19:37 PM.    Final     Microbiology Recent Results (from the past 240 hour(s))  Culture, blood (Routine x 2)     Status: Abnormal   Collection Time: 06/02/2021 10:27 PM   Specimen: BLOOD  Result Value Ref Range Status   Specimen Description   Final    BLOOD BLOOD RIGHT FOREARM Performed at [facility], [place] [address] [place], [place] [address]    Special Requests   Final     BOTTLES DRAWN AEROBIC AND ANAEROBIC Blood Culture adequate volume Performed at [facility] [address] [place] [address], [place], [place] [address]    Culture  Setup Time   Final    GRAM NEGATIVE RODS IN BOTH AEROBIC AND ANAEROBIC BOTTLES CRITICAL VALUE NOTED.  VALUE IS CONSISTENT WITH PREVIOUSLY REPORTED AND CALLED VALUE.    Culture (A)  Final    MORGANELLA MORGANII SUSCEPTIBILITIES PERFORMED ON PREVIOUS CULTURE WITHIN THE LAST 5 DAYS. Performed at [facility], [place] [address] [place], [place] [address]    Report Status 06/13/2021 FINAL  Final  Resp Panel by RT-PCR (Flu A&B, Covid) Nasopharyngeal Swab     Status: None   Collection Time: 05/20/2021 10:34 PM   Specimen: Nasopharyngeal Swab; Nasopharyngeal(NP) swabs in vial transport medium  Result Value Ref Range Status   SARS Coronavirus 2 by RT PCR NEGATIVE NEGATIVE Final    Comment: (NOTE) SARS-CoV-2 target nucleic acids are NOT DETECTED.  The SARS-CoV-2 RNA is generally detectable in upper respiratory specimens during the acute phase of infection. The lowest concentration of SARS-CoV-2 viral copies this assay can detect is 138 copies/mL. A negative result does not preclude SARS-Cov-2 infection and should not be used as the sole basis for treatment or other patient management decisions. A negative result may occur with  improper specimen collection/handling, submission of specimen other than nasopharyngeal swab, presence of viral mutation(s) within the areas targeted by this assay, and inadequate number of viral copies(<138 copies/mL). A negative result must be combined with clinical observations, patient history, and epidemiological information. The expected result is Negative.  Fact Sheet for Patients:  EntrepreneurPulse.com.au  Fact Sheet for Healthcare Providers:  IncredibleEmployment.be  This test is no t yet approved or cleared by the [place] FDA and  has been  authorized for detection and/or diagnosis of SARS-CoV-2 by FDA under an Emergency Use Authorization (EUA). This EUA will remain  in effect (meaning this test can be used) for the duration of the COVID-19 declaration under Section 564(b)(1) of the Act, 21 U.S.C.section 360bbb-3(b)(1), unless the authorization is terminated  or revoked sooner.       Influenza A by PCR NEGATIVE NEGATIVE Final   Influenza B by PCR NEGATIVE  NEGATIVE Final    Comment: (NOTE) The Xpert Xpress SARS-CoV-2/FLU/RSV plus assay is intended as an aid in the diagnosis of influenza from Nasopharyngeal swab specimens and should not be used as a sole basis for treatment. Nasal washings and aspirates are unacceptable for Xpert Xpress SARS-CoV-2/FLU/RSV testing.  Fact Sheet for Patients: EntrepreneurPulse.com.au  Fact Sheet for Healthcare Providers: IncredibleEmployment.be  This test is not yet approved or cleared by the [place] FDA and has been authorized for detection and/or diagnosis of SARS-CoV-2 by FDA under an Emergency Use Authorization (EUA). This EUA will remain in effect (meaning this test can be used) for the duration of the COVID-19 declaration under Section 564(b)(1) of the Act, 21 U.S.C. section 360bbb-3(b)(1), unless the authorization is terminated or revoked.  Performed at [facility], [place] [address], [place], [place] [address]   Culture, blood (Routine x 2)     Status: Abnormal (Preliminary result)   Collection Time: 05/16/2021 10:45 PM   Specimen: BLOOD  Result Value Ref Range Status   Specimen Description   Final    BLOOD BLOOD LEFT FOREARM Performed at [facility] [address], [place], [place] [address]    Special Requests   Final    BOTTLES DRAWN AEROBIC AND ANAEROBIC Blood Culture adequate volume Performed at [facility] [address] [place], [place] [address]    Culture  Setup  Time   Final    IN BOTH AEROBIC AND ANAEROBIC BOTTLES GRAM POSITIVE COCCI CRITICAL RESULT CALLED TO, READ BACK BY AND VERIFIED WITH: M SWAYNE PHARMD 06/11/21 1608 JDW GRAM NEGATIVE RODS CRITICAL RESULT CALLED TO, READ BACK BY AND VERIFIED WITH: PHARMD D WAFFORD [id number] AT 905 AN BY CM    Culture (A)  Final    STREPTOCOCCUS PNEUMONIAE MORGANELLA MORGANII SUSCEPTIBILITIES TO FOLLOW Performed at [facility], [place] [address] [place], [place] [address]    Report Status PENDING  Incomplete   Organism ID, Bacteria MORGANELLA MORGANII  Final      Susceptibility   Morganella morganii - MIC*    AMPICILLIN >=32 RESISTANT Resistant     CEFAZOLIN >=64 RESISTANT Resistant     CEFTAZIDIME <=1 SENSITIVE Sensitive     CIPROFLOXACIN <=0.25 SENSITIVE Sensitive     GENTAMICIN <=1 SENSITIVE Sensitive     IMIPENEM 2 SENSITIVE Sensitive     TRIMETH/SULFA <=20 SENSITIVE Sensitive     AMPICILLIN/SULBACTAM 16 INTERMEDIATE Intermediate     PIP/TAZO <=4 SENSITIVE Sensitive     * MORGANELLA MORGANII  Blood Culture ID Panel (Reflexed)     Status: Abnormal   Collection Time: 06/05/2021 10:45 PM  Result Value Ref Range Status   Enterococcus faecalis NOT DETECTED NOT DETECTED Final   Enterococcus Faecium NOT DETECTED NOT DETECTED Final   Listeria monocytogenes NOT DETECTED NOT DETECTED Final   Staphylococcus species NOT DETECTED NOT DETECTED Final   Staphylococcus aureus (BCID) NOT DETECTED NOT DETECTED Final   Staphylococcus epidermidis NOT DETECTED NOT DETECTED Final   Staphylococcus lugdunensis NOT DETECTED NOT DETECTED Final   Streptococcus species DETECTED (A) NOT DETECTED Final    Comment: CRITICAL RESULT CALLED TO, READ BACK BY AND VERIFIED WITH: Shelda Jakes PHARMD 06/11/21 1608 JDW    Streptococcus agalactiae NOT DETECTED NOT DETECTED Final   Streptococcus pneumoniae DETECTED (A) NOT DETECTED Final    Comment: CRITICAL RESULT CALLED TO, READ BACK BY AND VERIFIED WITH: M SWAYNE PHARMD 6/28 22 1608  JDW    Streptococcus pyogenes NOT DETECTED  NOT DETECTED Final   A.calcoaceticus-baumannii NOT DETECTED NOT DETECTED Final   Bacteroides fragilis NOT DETECTED NOT DETECTED Final   Enterobacterales DETECTED (A) NOT DETECTED Final    Comment: Enterobacterales represent a large order of gram negative bacteria, not a single organism. Refer to culture for further identification. CRITICAL RESULT CALLED TO, READ BACK BY AND VERIFIED WITH: Shelda Jakes [facility] 06/11/21 1608 JDW    Enterobacter cloacae complex NOT DETECTED NOT DETECTED Final   Escherichia coli NOT DETECTED NOT DETECTED Final   Klebsiella aerogenes NOT DETECTED NOT DETECTED Final   Klebsiella oxytoca NOT DETECTED NOT DETECTED Final   Klebsiella pneumoniae NOT DETECTED NOT DETECTED Final   Proteus species NOT DETECTED NOT DETECTED Final   Salmonella species NOT DETECTED NOT DETECTED Final   Serratia marcescens NOT DETECTED NOT DETECTED Final   Haemophilus influenzae NOT DETECTED NOT DETECTED Final   Neisseria meningitidis NOT DETECTED NOT DETECTED Final   Pseudomonas aeruginosa NOT DETECTED NOT DETECTED Final   Stenotrophomonas maltophilia NOT DETECTED NOT DETECTED Final   Candida albicans NOT DETECTED NOT DETECTED Final   Candida auris NOT DETECTED NOT DETECTED Final   Candida glabrata NOT DETECTED NOT DETECTED Final   Candida krusei NOT DETECTED NOT DETECTED Final   Candida parapsilosis NOT DETECTED NOT DETECTED Final   Candida tropicalis NOT DETECTED NOT DETECTED Final   Cryptococcus neoformans/gattii NOT DETECTED NOT DETECTED Final   CTX-M ESBL NOT DETECTED NOT DETECTED Final   Carbapenem resistance IMP NOT DETECTED NOT DETECTED Final   Carbapenem resistance KPC NOT DETECTED NOT DETECTED Final   Carbapenem resistance NDM NOT DETECTED NOT DETECTED Final   Carbapenem resist OXA 48 LIKE NOT DETECTED NOT DETECTED Final   Carbapenem resistance VIM NOT DETECTED NOT DETECTED Final    Comment: Performed at [facility], [address], [place], [place] [address]  Urine culture     Status: None   Collection Time: 06/11/21  4:24 AM   Specimen: In/Out Cath Urine  Result Value Ref Range Status   Specimen Description   Final    IN/OUT CATH URINE Performed at [facility] [address] [place], [place] [address]    Special Requests   Final    NONE Performed at [facility], [place] [address] [place], [place] [address]    Culture   Final    NO GROWTH Performed at [facility], [place] [address] [place], [place] [address]    Report Status 07/01/2021 FINAL  Final  Respiratory (~20 pathogens) panel by PCR     Status: None   Collection Time: 06/11/21  4:24 AM   Specimen: Nasopharyngeal Swab; Respiratory  Result Value Ref Range Status   Adenovirus NOT DETECTED NOT DETECTED Final   Coronavirus 229E NOT DETECTED NOT DETECTED Final    Comment: (NOTE) The Coronavirus on the Respiratory Panel, DOES NOT test for the novel  Coronavirus (2019 nCoV)    Coronavirus HKU1 NOT DETECTED NOT DETECTED Final   Coronavirus NL63 NOT DETECTED NOT DETECTED Final   Coronavirus OC43 NOT DETECTED NOT DETECTED Final   Metapneumovirus NOT DETECTED NOT DETECTED Final   Rhinovirus / Enterovirus NOT DETECTED NOT DETECTED Final   Influenza A NOT DETECTED NOT DETECTED Final   Influenza B NOT DETECTED NOT DETECTED Final   Parainfluenza Virus 1 NOT DETECTED NOT DETECTED Final   Parainfluenza Virus 2 NOT DETECTED NOT DETECTED Final   Parainfluenza Virus 3 NOT DETECTED NOT DETECTED Final  Parainfluenza Virus 4 NOT DETECTED NOT DETECTED Final   Respiratory Syncytial Virus NOT DETECTED NOT DETECTED Final   Bordetella pertussis NOT DETECTED NOT DETECTED Final   Bordetella Parapertussis NOT DETECTED NOT DETECTED Final   Chlamydophila pneumoniae NOT DETECTED NOT DETECTED Final   Mycoplasma pneumoniae NOT DETECTED NOT DETECTED Final    Comment: Performed at [facility], [place] [address],  [place], [place] [address]  MRSA PCR Screening     Status: None   Collection Time: 06/11/21 11:46 AM  Result Value Ref Range Status   MRSA by PCR NEGATIVE NEGATIVE Final    Comment:        The GeneXpert MRSA Assay (FDA approved for NASAL specimens only), is one component of a comprehensive MRSA colonization surveillance program. It is not intended to diagnose MRSA infection nor to guide or monitor treatment for MRSA infections. Performed at [facility], [place] [address], [place], [place] [address]     Lab Basic Metabolic Panel: Recent Labs  Lab 05/22/2021 2245 06/11/21 0706  NA 141 141  K 4.0 4.1  CL 100 102  CO2 26 29  GLUCOSE 114* 105*  BUN 40* 38*  CREATININE 1.13 0.76  CALCIUM 8.6* 8.2*  MG 2.3 2.0  PHOS  --  2.7   Liver Function Tests: Recent Labs  Lab 05/15/2021 2245  AST 28  ALT 14  ALKPHOS 88  BILITOT 0.8  PROT 6.4*  ALBUMIN 2.5*   No results for input(s): LIPASE, AMYLASE in the last 168 hours. No results for input(s): AMMONIA in the last 168 hours. CBC: Recent Labs  Lab 05/28/2021 2245 06/11/21 0706  WBC 3.6* 2.7*  NEUTROABS 3.5  --   HGB 10.2* 8.7*  HCT 32.6* 27.9*  MCV 92.9 93.0  PLT 245 148*   Cardiac Enzymes: No results for input(s): CKTOTAL, CKMB, CKMBINDEX, TROPONINI in the last 168 hours. Sepsis Labs: Recent Labs  Lab 05/17/2021 2245 06/11/21 0022 06/11/21 0145 06/11/21 0706 06/11/21 0708  PROCALCITON  --   --  77.28  --   --   WBC 3.6*  --   --  2.7*  --   LATICACIDVEN 6.9* 7.1*  --   --  5.1*    Procedures/Operations  none  Julian Hy 06/13/2021, 1:20 PM

## 2021-06-14 NOTE — Progress Notes (Signed)
Patient passed at 37. Pronounced by this RN and Farrell Ours RN. Patient belongings found in room sent to morgue with patient.

## 2021-06-14 NOTE — Progress Notes (Signed)
In reviewing documentation of other providers the patient's clinical course continues to worsen and he is not a candidate to leave the floor for radiotherapy, and death is anticipated in the coming days. His case has been discussed with his primary team. Given this situation, there would be little utility for radiotherapy as well given his limited prognosis. We will cancel remaining radiotherapy treatments.     Carola Rhine, PAC

## 2021-06-14 NOTE — Progress Notes (Signed)
  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     Indication for treatment::  palliative       Radiation treatment dates:   06/04/21-06/04/2021  Site/planned dose:   The T spine and chest including T5, as well as the sacrum and pelvis were treateed to 12.5 Gy over 5 fractions, the original prescription was for 37.5 Gy in 15 fractions.  Narrative: The patient's status declined during the course of radiotherapy. He was hospitalized and his status declined so much on 06/11/21, critical care did not feel he was stable medically to  leave the floor. We decided to forgo therapy as of 2021-07-03 while he transitioned to comfort care and subsequently passed away on 2021/07/03.     ________________________________     Carola Rhine, PAC

## 2021-06-14 DEATH — deceased

## 2021-06-18 ENCOUNTER — Other Ambulatory Visit: Payer: Self-pay

## 2021-06-18 ENCOUNTER — Ambulatory Visit: Payer: MEDICAID

## 2021-06-19 ENCOUNTER — Ambulatory Visit: Payer: MEDICAID

## 2021-06-20 ENCOUNTER — Ambulatory Visit: Payer: MEDICAID

## 2021-06-21 ENCOUNTER — Ambulatory Visit: Payer: MEDICAID

## 2021-06-24 ENCOUNTER — Ambulatory Visit: Payer: MEDICAID

## 2021-06-25 ENCOUNTER — Ambulatory Visit: Payer: MEDICAID

## 2021-06-25 ENCOUNTER — Other Ambulatory Visit: Payer: Self-pay

## 2021-06-25 ENCOUNTER — Ambulatory Visit: Payer: Self-pay | Admitting: Hematology and Oncology

## 2021-06-26 ENCOUNTER — Ambulatory Visit: Payer: Self-pay

## 2021-06-26 ENCOUNTER — Ambulatory Visit: Payer: MEDICAID

## 2021-06-27 ENCOUNTER — Ambulatory Visit: Payer: MEDICAID

## 2021-07-16 ENCOUNTER — Other Ambulatory Visit: Payer: Self-pay

## 2021-07-16 ENCOUNTER — Ambulatory Visit: Payer: Self-pay | Admitting: Hematology and Oncology

## 2021-07-16 ENCOUNTER — Ambulatory Visit: Payer: Self-pay

## 2021-08-06 ENCOUNTER — Ambulatory Visit: Payer: Self-pay

## 2021-08-06 ENCOUNTER — Other Ambulatory Visit: Payer: Self-pay

## 2021-08-06 ENCOUNTER — Ambulatory Visit: Payer: Self-pay | Admitting: Hematology and Oncology

## 2021-08-27 ENCOUNTER — Other Ambulatory Visit: Payer: Self-pay

## 2021-08-27 ENCOUNTER — Ambulatory Visit: Payer: Self-pay | Admitting: Hematology and Oncology

## 2021-08-27 ENCOUNTER — Ambulatory Visit: Payer: Self-pay

## 2022-12-01 IMAGING — CT CT L SPINE W/O CM
3 series · 13 of 33 positions shown, 16 images · IV contrast (omnipaque)
Comparison: Chest x-ray 05/17/2021

CLINICAL DATA: Lower back pain status post fall. Partial collapse
of left lung with associated infiltrate and pleural effusion.
Question underlying mass on chest x-ray.

EXAM:
CT CHEST, ABDOMEN, AND PELVIS WITH CONTRAST
CT LUMBAR SPINE WITHOUT CONTRAST
TECHNIQUE: Multidetector CT imaging of the chest, abdomen and pelvis was
performed following the standard protocol during bolus
administration of intravenous contrast.
Multidetector CT imaging of the lumbar spine was performed without
intravenous contrast administration. Multiplanar CT image
reconstructions were also generated.
CONTRAST:  100mL OMNIPAQUE IOHEXOL 300 MG/ML  SOLN

[Series 4: axial st l-spine · axial · 0.31mm/px · z∈[-504,-346]mm · 5 of 115 slices shown, 7 images]
[im 18/115  soft-tissue]
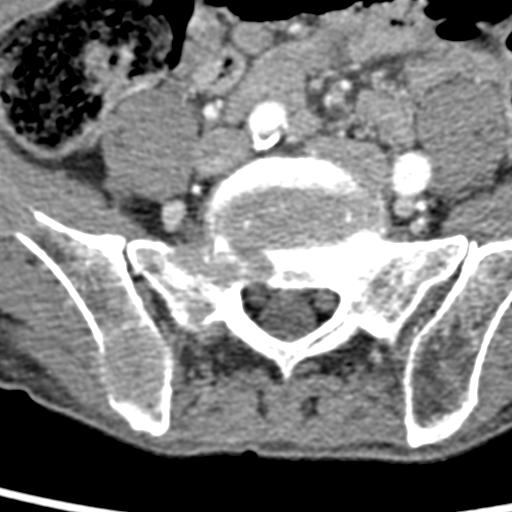
[im 18/115  bone]
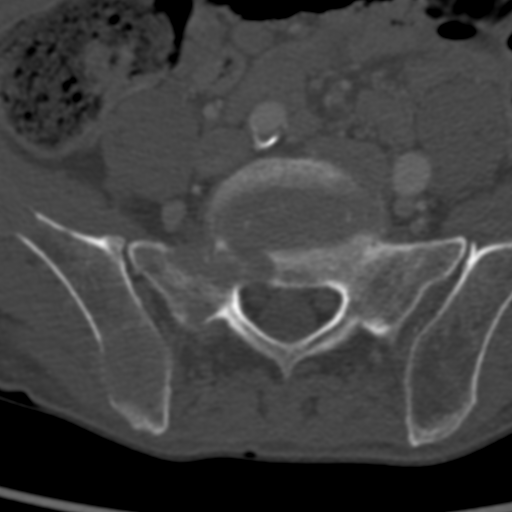
[im 36/115  bone]
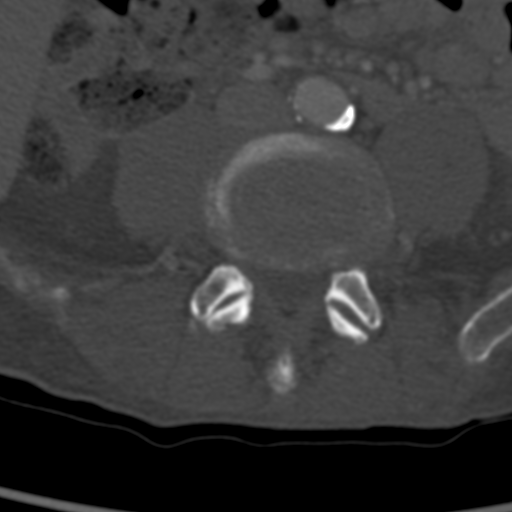
[im 62/115  bone]
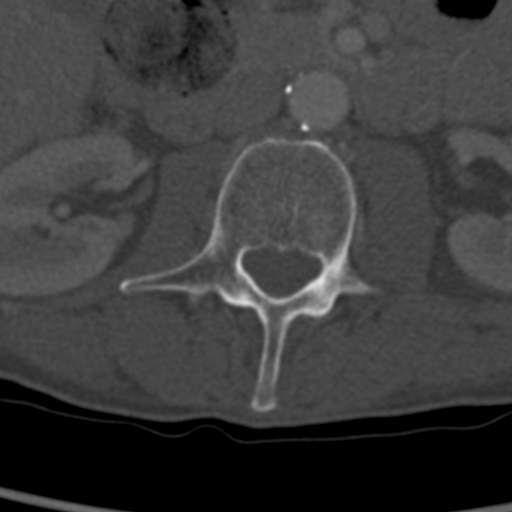
[im 79/115  bone]
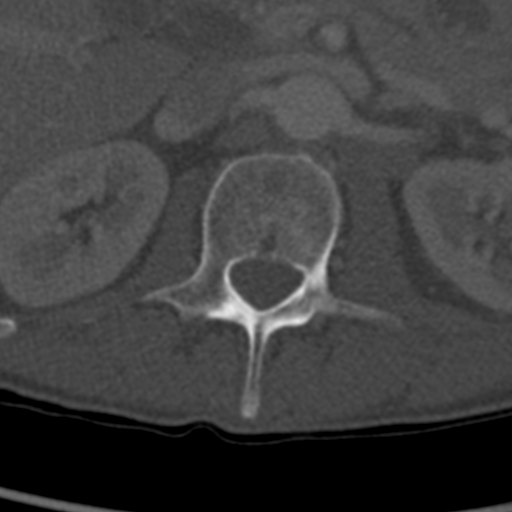
[im 97/115  soft-tissue]
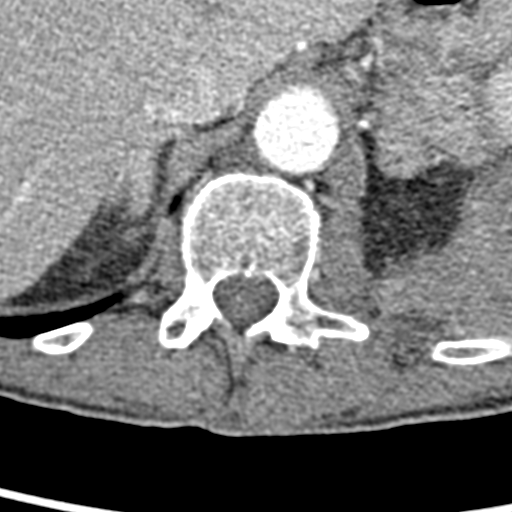
[im 97/115  bone]
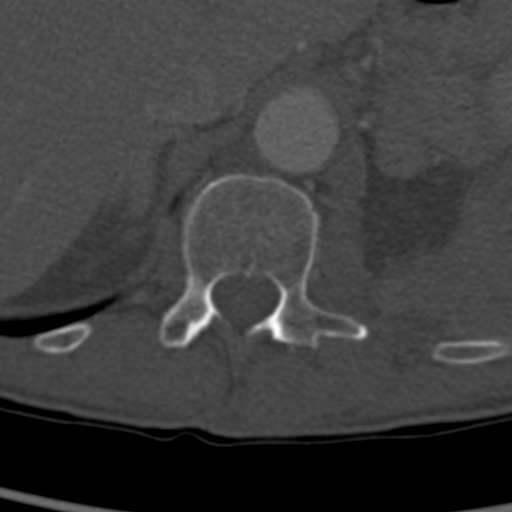

[Series 503: coronal bone · coronal · 0.45mm/px · 3 of 51 slices shown]
[im 11/51  bone]
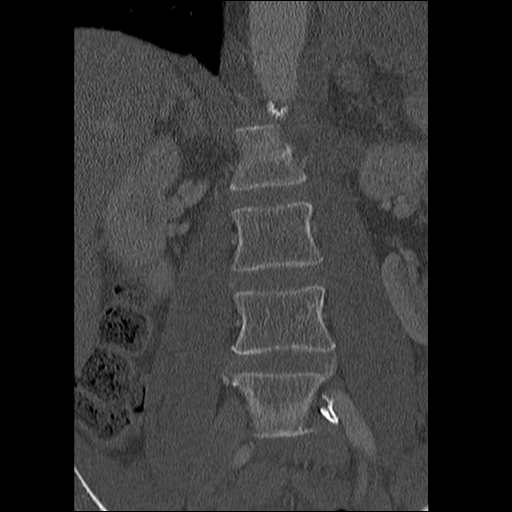
[im 21/51  bone]
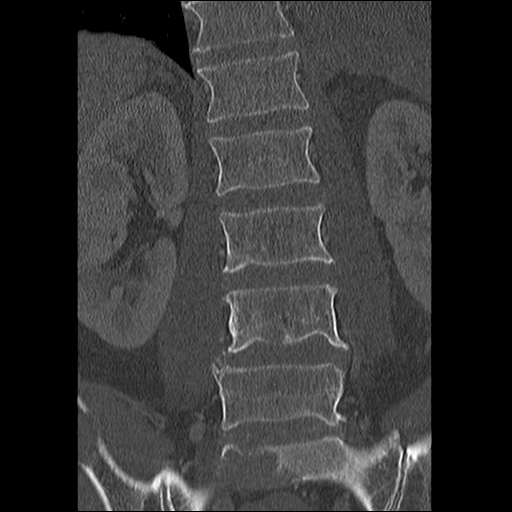
[im 31/51  bone]
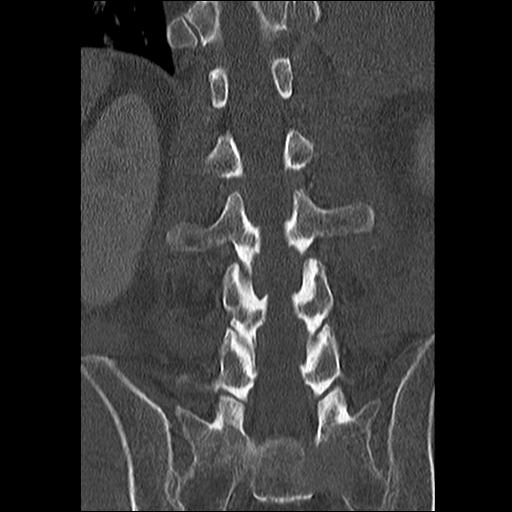

[Series 504: sagittal bone · sagittal · 0.45mm/px · 5 of 46 slices shown, 6 images]
[im 16/46  bone]
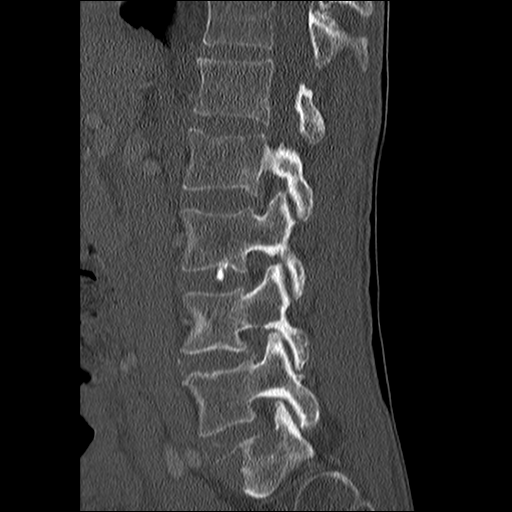
[im 19/46  bone]
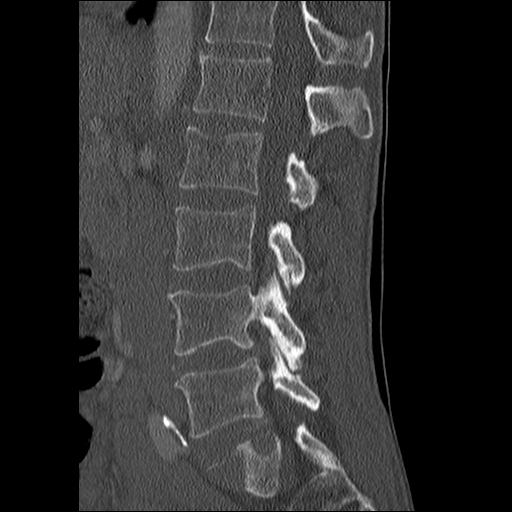
[im 23/46  soft-tissue]
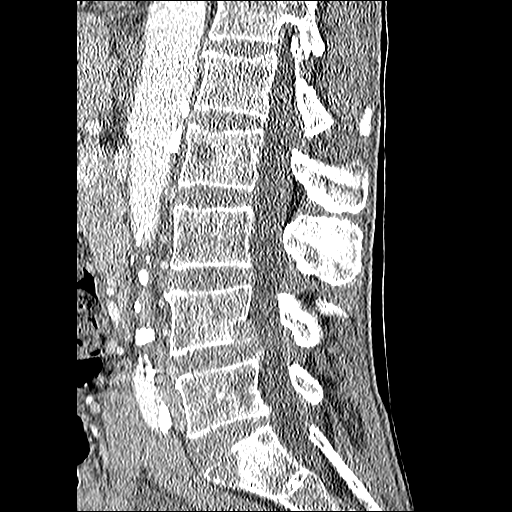
[im 23/46  bone]
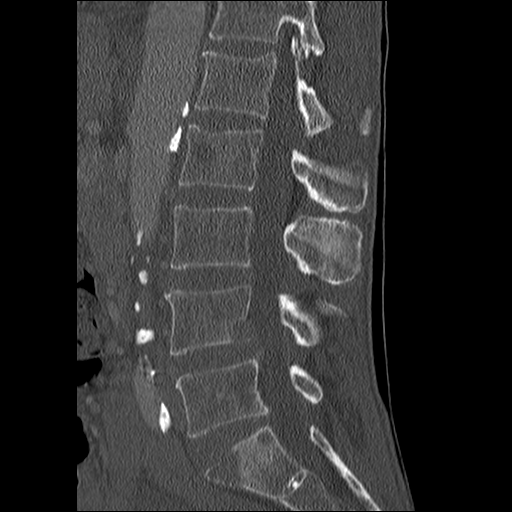
[im 27/46  bone]
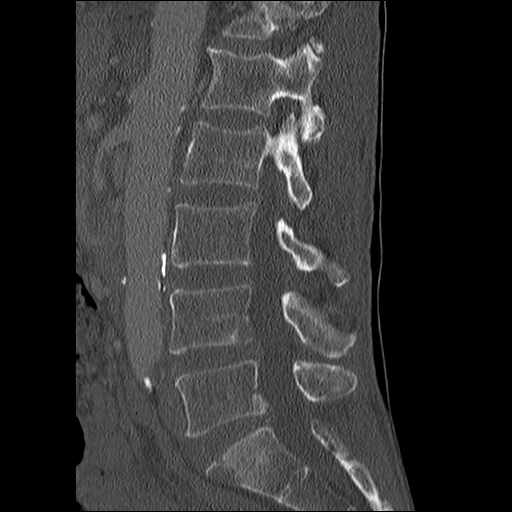
[im 31/46  bone]
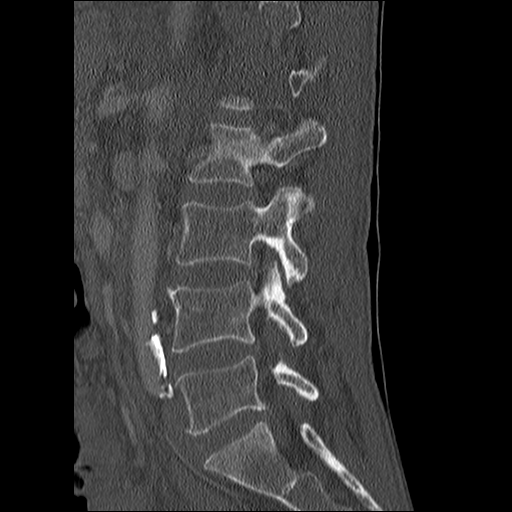

[13 of 33 positions shown; findings below may reference images not displayed]

FINDINGS: CT CHEST FINDINGS

Cardiovascular: Normal heart size. No significant pericardial
effusion. The thoracic aorta is normal in caliber. Mild-to-moderate
atherosclerotic plaque of the thoracic aorta. At least 3 vessel
coronary artery calcifications. The main pulmonary artery is
enlarged in caliber measuring up to 3.7 cm. No central or proximal
segmental pulmonary embolus.

Mediastinum/Nodes: Mediastinal lymphadenopathy with as an example a
1.5 cm precarinal lymph node ([DATE]). Also noted subcarinal lymph
node measuring 1.6 cm ([DATE]). No definite right hilar
lymphadenopathy. Left hilar lymphadenopathy unable to measure.

Lungs/Pleura:

Almost complete heterogeneous opacification of the left hemithorax
with associated pleural thickening and nodularity. Underlying
pulmonary mass is difficult to measure. Invasion into the
mediastinum is noted. Associated narrowing of the left main
pulmonary artery as well as left mainstem bronchus. Associated
loculated left pleural effusion.

Several right lower lobe pulmonary nodule measuring up to 0.9 x
cm ([DATE]). Calcified pulmonary nodule within the right upper lobe.
Pulmonary micronodule within the right upper lobe ([DATE]).

Debris within the right mainstem bronchus and trachea. As well as
within the right lower lobe bronchials.

Musculoskeletal: Scattered axial and appendicular skeleton lytic
lesions. Question pathologic nondisplaced fracture of the right
posterior tenth rib ([DATE]).

CT ABDOMEN PELVIS FINDINGS

Hepatobiliary: No focal liver abnormality. No gallstones,
gallbladder wall thickening, or pericholecystic fluid. No biliary
dilatation.

Pancreas: No focal lesion. Normal pancreatic contour. No surrounding
inflammatory changes. No main pancreatic ductal dilatation.

Spleen: Normal in size without focal abnormality.

Adrenals/Urinary Tract:

No right adrenal nodule. There is a retroperitoneal left upper 3.3 x
2.1 cm lobulated lesion that may be arising from the left adrenal
gland.

Bilateral kidneys enhance symmetrically. No hydronephrosis. No
hydroureter.

The urinary bladder is unremarkable.

Stomach/Bowel: Stomach is within normal limits. No evidence of bowel
wall thickening or dilatation. Appendix appears normal.

Vascular/Lymphatic: No abdominal aorta or iliac aneurysm. Moderate
atherosclerotic plaque of the aorta and its branches. No pelvic or
inguinal lymphadenopathy.

Reproductive: The prostate is enlarged measuring up to 5.1 cm.

Other: No intraperitoneal free fluid. No intraperitoneal free gas.
No organized fluid collection.

Musculoskeletal: Scattered axial and appendicular skeleton lytic
lesions.

CT LUMBAR SPINE:

Segmentation: 5 non-rib-bearing lumbar vertebral bodies.

Alignment: Normal.

Vertebra: Several lytic lesions with cortical destruction, as an
example: Posterior wall and inferior endplate of the L4 level
([DATE]), right iliac bone ([DATE]), S1 level ([DATE], [DATE]).

Soft tissues: Question associated soft tissue densities along the
lytic lesions with as an example along the right iliac bone ([DATE]).
IMPRESSION: 1. Left hemithorax malignancy with almost complete opacification and
associated pleural thickening/nodularity. Underlying pulmonary mass
difficult to measure/visualize. Associated loculated left pleural
effusion. Invasion into the mediastinum with associated narrowing of
the left main pulmonary artery as well as left mainstem bronchus.
2. Mediastinal and left hilar lymphadenopathy.
3. Several right lower lobe pulmonary nodules measuring up to 0.9 x
0.5 cm. Findings could represent metastases versus
infection/inflammation given debris within the right mainstem
bronchus and trachea as well as within the right lower lobe
bronchioles with associated right lower lobe mucous plugging.
4. Axial and appendicular skeleton lytic lesions with cortical
destruction consistent with metastases.
5. Question pathologic nondisplaced fracture of the right posterior
tenth rib.
6. A left retroperitoneal 3.3 x 2.1 cm lobulated lesion may be
arising from the left adrenal gland versus represent a lymph node.
Finding likely metastasis.
7. Prostatomegaly.
